# Patient Record
Sex: Female | Born: 1985
Health system: Southern US, Community
[De-identification: ages and names within clinical notes are randomized; demographics above are authoritative.]

## PROBLEM LIST (undated history)

## (undated) ENCOUNTER — Inpatient Hospital Stay (HOSPITAL_COMMUNITY): Payer: Self-pay

## (undated) DIAGNOSIS — J302 Other seasonal allergic rhinitis: Secondary | ICD-10-CM

## (undated) DIAGNOSIS — N926 Irregular menstruation, unspecified: Secondary | ICD-10-CM

## (undated) DIAGNOSIS — N750 Cyst of Bartholin's gland: Secondary | ICD-10-CM

## (undated) DIAGNOSIS — Z8632 Personal history of gestational diabetes: Secondary | ICD-10-CM

## (undated) DIAGNOSIS — Z9109 Other allergy status, other than to drugs and biological substances: Secondary | ICD-10-CM

## (undated) DIAGNOSIS — D573 Sickle-cell trait: Secondary | ICD-10-CM

## (undated) DIAGNOSIS — N390 Urinary tract infection, site not specified: Secondary | ICD-10-CM

## (undated) DIAGNOSIS — T7840XA Allergy, unspecified, initial encounter: Secondary | ICD-10-CM

## (undated) DIAGNOSIS — R51 Headache: Secondary | ICD-10-CM

## (undated) DIAGNOSIS — IMO0002 Reserved for concepts with insufficient information to code with codable children: Secondary | ICD-10-CM

## (undated) DIAGNOSIS — O24419 Gestational diabetes mellitus in pregnancy, unspecified control: Secondary | ICD-10-CM

## (undated) DIAGNOSIS — E785 Hyperlipidemia, unspecified: Secondary | ICD-10-CM

## (undated) DIAGNOSIS — E119 Type 2 diabetes mellitus without complications: Secondary | ICD-10-CM

## (undated) DIAGNOSIS — O139 Gestational [pregnancy-induced] hypertension without significant proteinuria, unspecified trimester: Secondary | ICD-10-CM

## (undated) HISTORY — PX: THERAPEUTIC ABORTION: SHX798

## (undated) HISTORY — PX: OTHER SURGICAL HISTORY: SHX169

---
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
## 2006-01-19 ENCOUNTER — Other Ambulatory Visit: Admission: RE | Admit: 2006-01-19 | Discharge: 2006-01-19 | Payer: Self-pay | Admitting: Obstetrics and Gynecology

## 2006-05-09 ENCOUNTER — Ambulatory Visit (HOSPITAL_COMMUNITY): Admission: RE | Admit: 2006-05-09 | Discharge: 2006-05-09 | Payer: Self-pay | Admitting: Obstetrics and Gynecology

## 2006-08-06 ENCOUNTER — Emergency Department (HOSPITAL_COMMUNITY): Admission: EM | Admit: 2006-08-06 | Discharge: 2006-08-06 | Payer: Self-pay | Admitting: Emergency Medicine

## 2006-10-25 ENCOUNTER — Emergency Department (HOSPITAL_COMMUNITY): Admission: EM | Admit: 2006-10-25 | Discharge: 2006-10-25 | Payer: Self-pay | Admitting: Emergency Medicine

## 2007-10-01 ENCOUNTER — Emergency Department (HOSPITAL_COMMUNITY): Admission: EM | Admit: 2007-10-01 | Discharge: 2007-10-01 | Payer: Self-pay | Admitting: Emergency Medicine

## 2008-03-21 ENCOUNTER — Other Ambulatory Visit: Admission: RE | Admit: 2008-03-21 | Discharge: 2008-03-21 | Payer: Self-pay | Admitting: Obstetrics and Gynecology

## 2008-03-21 ENCOUNTER — Encounter: Payer: Self-pay | Admitting: Obstetrics and Gynecology

## 2008-03-21 ENCOUNTER — Ambulatory Visit: Payer: Self-pay | Admitting: Obstetrics and Gynecology

## 2008-07-29 ENCOUNTER — Emergency Department (HOSPITAL_COMMUNITY): Admission: EM | Admit: 2008-07-29 | Discharge: 2008-07-29 | Payer: Self-pay | Admitting: Emergency Medicine

## 2008-09-15 ENCOUNTER — Ambulatory Visit: Payer: Self-pay | Admitting: Physician Assistant

## 2008-09-15 ENCOUNTER — Inpatient Hospital Stay (HOSPITAL_COMMUNITY): Admission: AD | Admit: 2008-09-15 | Discharge: 2008-09-16 | Payer: Self-pay | Admitting: Obstetrics & Gynecology

## 2008-09-19 ENCOUNTER — Emergency Department (HOSPITAL_COMMUNITY): Admission: EM | Admit: 2008-09-19 | Discharge: 2008-09-19 | Payer: Self-pay | Admitting: Family Medicine

## 2008-10-24 ENCOUNTER — Emergency Department (HOSPITAL_COMMUNITY): Admission: EM | Admit: 2008-10-24 | Discharge: 2008-10-24 | Payer: Self-pay | Admitting: Family Medicine

## 2009-03-25 ENCOUNTER — Emergency Department (HOSPITAL_COMMUNITY): Admission: EM | Admit: 2009-03-25 | Discharge: 2009-03-25 | Payer: Self-pay | Admitting: Emergency Medicine

## 2009-09-15 ENCOUNTER — Emergency Department (HOSPITAL_COMMUNITY): Admission: EM | Admit: 2009-09-15 | Discharge: 2009-09-15 | Payer: Self-pay | Admitting: Family Medicine

## 2009-10-03 ENCOUNTER — Emergency Department (HOSPITAL_COMMUNITY): Admission: EM | Admit: 2009-10-03 | Discharge: 2009-10-03 | Payer: Self-pay | Admitting: Emergency Medicine

## 2009-10-27 ENCOUNTER — Ambulatory Visit: Payer: Self-pay | Admitting: Family

## 2009-10-27 ENCOUNTER — Inpatient Hospital Stay (HOSPITAL_COMMUNITY): Admission: AD | Admit: 2009-10-27 | Discharge: 2009-10-27 | Payer: Self-pay | Admitting: Obstetrics & Gynecology

## 2010-03-06 ENCOUNTER — Encounter: Payer: Self-pay | Admitting: Obstetrics and Gynecology

## 2010-04-29 LAB — WOUND CULTURE: Culture: NORMAL

## 2010-04-30 LAB — POCT PREGNANCY, URINE: Preg Test, Ur: NEGATIVE

## 2010-04-30 LAB — WET PREP, GENITAL
Trich, Wet Prep: NONE SEEN
Yeast Wet Prep HPF POC: NONE SEEN

## 2010-04-30 LAB — POCT URINALYSIS DIPSTICK
Bilirubin Urine: NEGATIVE
Glucose, UA: NEGATIVE mg/dL
Hgb urine dipstick: NEGATIVE
Ketones, ur: NEGATIVE mg/dL
Nitrite: NEGATIVE
Protein, ur: NEGATIVE mg/dL
Specific Gravity, Urine: 1.015 (ref 1.005–1.030)
Urobilinogen, UA: 0.2 mg/dL (ref 0.0–1.0)
pH: 5.5 (ref 5.0–8.0)

## 2010-04-30 LAB — GC/CHLAMYDIA PROBE AMP, GENITAL
Chlamydia, DNA Probe: POSITIVE — AB
GC Probe Amp, Genital: NEGATIVE

## 2010-05-22 LAB — POCT PREGNANCY, URINE: Preg Test, Ur: POSITIVE

## 2010-05-22 LAB — WET PREP, GENITAL
Clue Cells Wet Prep HPF POC: NONE SEEN
Trich, Wet Prep: NONE SEEN

## 2010-05-22 LAB — GC/CHLAMYDIA PROBE AMP, GENITAL
Chlamydia, DNA Probe: NEGATIVE
GC Probe Amp, Genital: NEGATIVE

## 2010-06-30 ENCOUNTER — Other Ambulatory Visit (HOSPITAL_COMMUNITY)
Admission: RE | Admit: 2010-06-30 | Discharge: 2010-06-30 | Disposition: A | Discharge status: Home / Self Care | Payer: Medicaid Other | Source: Ambulatory Visit | Attending: Obstetrics & Gynecology | Admitting: Obstetrics & Gynecology

## 2010-06-30 ENCOUNTER — Other Ambulatory Visit: Payer: Self-pay | Admitting: Obstetrics & Gynecology

## 2010-06-30 ENCOUNTER — Encounter: Payer: Medicaid Other | Admitting: Obstetrics & Gynecology

## 2010-06-30 DIAGNOSIS — R87610 Atypical squamous cells of undetermined significance on cytologic smear of cervix (ASC-US): Secondary | ICD-10-CM

## 2010-06-30 DIAGNOSIS — N870 Mild cervical dysplasia: Secondary | ICD-10-CM | POA: Insufficient documentation

## 2010-07-02 NOTE — H&P (Signed)
Lauren [name], Lauren [name]                         ACCOUNT NO.:  [id number]   MEDICAL RECORD NO.:  [id number]                   PATIENT TYPE:   LOCATION:                                       FACILITY:   PHYSICIAN:  Charles A. Sydnee Cabal, MD            DATE OF BIRTH:  [birth year]   DATE OF ADMISSION:  06/01/2003  DATE OF DISCHARGE:                                HISTORY & PHYSICAL   CHIEF COMPLAINT:  Pregnancy will be at 36 weeks on Monday, June 02, 2003,  with pregnancy induced hypertension.   HISTORY OF PRESENT ILLNESS:  This is a 25 year old para 0, 0, 0, 0 with  estimated date of confinement Jul 02, 2003 which will put her at 36 weeks on  Monday.  To be admitted Sunday night for Cervidil induction.  She has been  admitted over night x2 this past week for hypertension with blood pressures  fluctuating up to 140's to low 150's over 101 to 104.  These would respond  to bed rest but during the hospitalization would fluctuate up into the upper  140's and 90's, mainly down in the 140's over low 90's.  PIH labs have been  negative, last labs were drawn on May 29, 2003:  Creatinine 0.7, uric acid  4.7, AST 15, platelets 233,000,  hematocrit 36.7, hemoglobin 12.5.  Urinalysis has negative protein.  A 24  hour urine was done earlier this week and as I recall was 39 mg total.  Group B strep culture was negative this week.  She is now to be admitted  secondary to ongoing hypertension beyond mild range at times, somewhat  labile, and that she will be at 36 weeks.  She denies pregnancy induced  hypertension symptoms such as scotomata, right upper quadrant pain, headache  or blurred vision.  She notes active fetal movement, denies contractions.  Her NST is reactive in the office today, heart rate 150's.   PAST MEDICAL HISTORY:  None.   SURGICAL HISTORY:  None.   MEDICATIONS:  Prenatal vitamins.   ALLERGIES:  No known drug allergies.   SOCIAL HISTORY:  No tobacco, ethanol or drug use.  She  has a boyfriend who  is supportive currently.   FAMILY HISTORY:  Noncontributory.   For obstetrical labs, please see [facility] chart.  Urinalysis negative for  protein.   PHYSICAL EXAMINATION:  GENERAL:  Alert, oriented, in no distress.  VITAL SIGNS:  Blood pressure 144/94.  HEENT:  Exam grossly within normal limits.  NECK:  Supple without thyromegaly or adenopathy.  LUNGS:  Clear bilaterally.  HEART:  Regular rate and rhythm with a 2/6 systolic ejection murmur at the  left sternal border.  BREAST EXAM:  Deferred, breasts are symmetrical.  ABDOMEN:  Fundal height 35 to 36 cm, gravid, soft and nontender.  Fetal  heart tones are 150's.  Reactive NST, 140 to 150's today.  EXTREMITIES:  Mild edema bilaterally.  Deep tendon reflexes  1 to 2+ and  symmetrical.  No significant clonus.   ASSESSMENT:  Pregnancy induced hypertension without proteinuria and not  meeting criteria for preeclampsia at this time but labile and concerning,  worsening over the last several weeks at bedrest for the last week strictly.  I have discussed alternatives of continued expectant management with the  patient and her family.  Leaned heavily towards induction.  I feel this is  to be considered as prudent in that she has had to be admitted x2 over the  last week secondary to hypertension, and now she is to be found at 36 weeks  the first part of next week when the induction is planned.  All questions  were answered, she understands we will proceed with Cervidil induction to be  followed with Pitocin.  If pressures elevate to a higher range will consider  magnesium prophylaxis.  If labs return abnormal would institute magnesium  prophylaxis, or if she were to become symptomatic, would consider same.  Otherwise if patient maintains acceptable mild pressures no lab changes and  no symptoms, I would try to get her into labor without using magnesium  prophylaxis to retard induction.  She gave informed consent and  will present  Sunday night for induction as noted at this time.  She will remain at  complete bed rest other than getting up for meals or to the bathroom.  Labs  will be repeated Sunday night.                                               Charles A. Sydnee Cabal, MD    CAD/MEDQ  D:  05/29/2003  T:  05/29/2003  Job:  [id number]

## 2010-07-02 NOTE — H&P (Signed)
[name], [name]                       ACCOUNT NO.:  [id number]   MEDICAL RECORD NO.:  [id number]                   PATIENT TYPE:  INP   LOCATION:  9161                                 FACILITY:  [facility]   PHYSICIAN:  [name]. Sydnee Cabal, MD            DATE OF BIRTH:  Feb 06, 1986   DATE OF ADMISSION:  05/26/2003  DATE OF DISCHARGE:                                HISTORY & PHYSICAL   CHIEF COMPLAINT:  1. Vaginal bleeding.  2. Pregnant [redacted] weeks and 5 days.   HISTORY OF PRESENT ILLNESS:  A 25 year old, para 0-0-0-0, [facility] Jul 02, 2003,  putting her at estimated gestational age [redacted] weeks and 5 days.  Now admitted  for 23-hour observation versus induction depending on the outcome of  laboratory and ultrasound testing.  She has been on bed rest for the last  week.  She was admitted for overnight observation last Friday.  Blood  pressures initially 140s/101.  These normalized overnight.  Pregnancy-  induced hypertension laboratories were negative.  She did collect a 24-hour  specimen over the weekend and brings it now to be turned in.  She has no  pregnancy-induced hypertension symptoms, such scotomata, right upper  quadrant pain, headache, or blurred vision.  She noted at 1 p.m. this  afternoon when she got up to go to the bathroom from bed rest that she  passed several small clots.  She did not bring these in.  She has not had  further bleeding since that time.  She notes active fetal movement.  Denies  contractions or ruptured membranes.  NST was reactive here in the office  this afternoon.   PAST MEDICAL HISTORY:  None.   PAST SURGICAL HISTORY:  None.   MEDICATIONS:  Prenatal vitamins.   ALLERGIES:  No known drug allergies.   SOCIAL HISTORY:  No tobacco, ethanol, or drug use.  She has a boyfriend  currently.   FAMILY HISTORY:  Noncontributory.   OBSTETRICAL LABORATORIES:  O positive.  Antibody screen negative.  She is a  sickle cell carrier.  VDRL negative.  Rubella immune.   Hepatitis B surface  antigen negative.  HIV negative.  Pap negative on February 11, 2003.  GC and  chlamydia cultures negative.  A group B Streptococcus  culture was done  today and is pending.  The hemoglobin was 11.2 on April 09, 2003.  One-  hour Glucola 98.  Quad screen normal.  The last PIH laboratories showed  creatinine 0.6, uric acid 4.7, AST 13, and platelets 298.  These were done  in my office on May 21, 2003.  Hospital laboratories were not in the chart,  but were normal as well.  See the hospital record.   PHYSICAL EXAMINATION:  GENERAL APPEARANCE:  Alert and oriented x 3.  No  distress.  VITAL SIGNS:  Blood pressure here in the office 140/92 and repeated 144/94.  Trace to no protein was noted on  urine specimen.  Respirations 16, pulse 90.  HEENT:  Grossly within normal limits.  NECK:  Supple.  CORONARY:  Regular rate and rhythm.  A 2/6 systolic ejection murmur at the  left sternal border.  LUNGS:  Clear bilaterally.  ABDOMEN:  Gravid.  Fundal height 36 cm.  PELVIC:  Normal external female genitalia.  A speculum was placed.  There  was a very slight amount of pink mucoid material in the vagina.  No clots  were seen.  No frank blood was seen.  Nitrazine was equivocal secondary to  the mucus.  There was no evidence of a pool.  The cervix appeared closed.  On digital examination, the cervix was closed.   ASSESSMENT:  1. Unexplained third trimester bleeding.  2. Pregnancy-induced hypertension consistent with mild preeclampsia versus     pregnancy-induced hypertension.  Proteinuria has not been noted to this     point.  Laboratory testing for [facility] laboratories have been negative to     this point.  Blood pressure has responded to bed rest and remains in a     mild range.   PLAN:  The patient will be admitted.  Continuous fetal monitoring will be  undertaken.  She will be observed for further bleeding.  An ultrasound will  be done for AFI, placenta, and position check.   Laboratories will be done:  Kleihauer-Betke, CBC, and PIH panel.  Serial blood pressures will be  undertaken.  If she manifests further significant bleeding, consider partial  abruption and induction would be warranted to be done carefully.  If serial  blood pressures are in the severe range, induction will be warranted as  well.  If no further bleeding is noted and blood pressures normalize and PIH  laboratories are negative, will continue in observation for one to two days  perhaps and consider discharge at that time depending on findings as noted  above.  The patient is in agreement with this recommendation.  We will have  her maintain bed rest with bathroom privileges overnight.                                               [name]. Sydnee Cabal, MD    CAD/MEDQ  D:  05/26/2003  T:  05/26/2003  Job:  [id number]

## 2010-07-02 NOTE — Op Note (Signed)
NAMEBEVERELY, SUEN A                       ACCOUNT NO.:  [id number]   MEDICAL RECORD NO.:  [id number]                   PATIENT TYPE:  INP   LOCATION:  9112                                 FACILITY:  [facility]   PHYSICIAN:  Charles A. Sydnee Cabal, MD            DATE OF BIRTH:  1985-09-04   DATE OF PROCEDURE:  06/02/2003  DATE OF DISCHARGE:                                 OPERATIVE REPORT   This patient was admitted secondary to pregnancy induced hypertension, no  proteinuria and PIH labs were normal.  For this reason, I did not put her on  magnesium.  She has had blood pressures in the 130s/70s at rest when family  is gone but up to 150s/105 when family is present and sitting up.  After  family is gone, sitting up pressures were 140s/80s.  She is 25 years old.  See history and physical as dictated.   At 0810 this morning, the patient was a dimple dilated.  She was begun on  high dose Pitocin after Cervidil was removed.  At 1140 cervix was unchanged.  She continued on high dose Pitocin at 20 mIU per minute.  Fetal heart rate  was reactive without decelerations.  At 1820 she was 3, 90, -1, blood  pressures 140s/80s, 150/106 with a contraction.  No PIH symptoms again.  Fetal heart rate 140 to 150 without decelerations and reactive.  Epidural  was placed shortly after this time at which point I ruptured membranes with  consent and clear amniotic fluid was noted.  She progressed on to complete  cervical dilation at 2125. She had spontaneous vaginal delivery at 2153.  Placenta followed spontaneously at 2200.  There was a first degree  periurethral laceration.  This was repaired with local anesthetic and 3-0  Vicryl.  Placenta was spontaneous, three vessel and intact.  Nuchal cord was  reduced for delivery.  Father of the baby cut the cord.  Estimated blood  loss was 300 mL.  Vigorous female, Apgars 8 and 9.  Cord blood gases  returned 7.27 arterial, 7.36 venous.  Mother and baby recovering and  stable  at this time.                                               Charles A. Sydnee Cabal, MD    CAD/MEDQ  D:  06/02/2003  T:  06/03/2003  Job:  [id number]

## 2010-07-19 ENCOUNTER — Ambulatory Visit: Payer: Medicaid Other | Admitting: Family Medicine

## 2010-08-11 ENCOUNTER — Ambulatory Visit: Payer: Medicaid Other | Admitting: Family Medicine

## 2010-09-24 ENCOUNTER — Inpatient Hospital Stay (INDEPENDENT_AMBULATORY_CARE_PROVIDER_SITE_OTHER)
Admission: RE | Admit: 2010-09-24 | Discharge: 2010-09-24 | Disposition: A | Discharge status: Home / Self Care | Payer: Self-pay | Source: Ambulatory Visit | Attending: Emergency Medicine | Admitting: Emergency Medicine

## 2010-09-24 DIAGNOSIS — Z331 Pregnant state, incidental: Secondary | ICD-10-CM

## 2010-09-24 DIAGNOSIS — L0291 Cutaneous abscess, unspecified: Secondary | ICD-10-CM

## 2010-09-26 ENCOUNTER — Inpatient Hospital Stay (HOSPITAL_COMMUNITY): Payer: Self-pay

## 2010-09-26 ENCOUNTER — Encounter (HOSPITAL_COMMUNITY): Payer: Self-pay | Admitting: *Deleted

## 2010-09-26 ENCOUNTER — Inpatient Hospital Stay (HOSPITAL_COMMUNITY)
Admission: AD | Admit: 2010-09-26 | Discharge: 2010-09-26 | Disposition: A | Discharge status: Home / Self Care | Payer: Self-pay | Source: Ambulatory Visit | Attending: Obstetrics & Gynecology | Admitting: Obstetrics & Gynecology

## 2010-09-26 DIAGNOSIS — N760 Acute vaginitis: Secondary | ICD-10-CM | POA: Insufficient documentation

## 2010-09-26 DIAGNOSIS — A499 Bacterial infection, unspecified: Secondary | ICD-10-CM | POA: Insufficient documentation

## 2010-09-26 DIAGNOSIS — B9689 Other specified bacterial agents as the cause of diseases classified elsewhere: Secondary | ICD-10-CM | POA: Insufficient documentation

## 2010-09-26 DIAGNOSIS — O2390 Unspecified genitourinary tract infection in pregnancy, unspecified trimester: Secondary | ICD-10-CM | POA: Insufficient documentation

## 2010-09-26 DIAGNOSIS — R109 Unspecified abdominal pain: Secondary | ICD-10-CM | POA: Insufficient documentation

## 2010-09-26 DIAGNOSIS — Z331 Pregnant state, incidental: Secondary | ICD-10-CM

## 2010-09-26 LAB — URINALYSIS, ROUTINE W REFLEX MICROSCOPIC
Bilirubin Urine: NEGATIVE
Glucose, UA: NEGATIVE mg/dL
Ketones, ur: NEGATIVE mg/dL
pH: 5.5 (ref 5.0–8.0)

## 2010-09-26 LAB — HCG, QUANTITATIVE, PREGNANCY: hCG, Beta Chain, Quant, S: 25398 m[IU]/mL — ABNORMAL HIGH (ref <5)

## 2010-09-26 LAB — WET PREP, GENITAL
Trich, Wet Prep: NONE SEEN
Yeast Wet Prep HPF POC: NONE SEEN

## 2010-09-26 LAB — CBC
Hemoglobin: 12.8 g/dL (ref 12.0–15.0)
MCH: 28.8 pg (ref 26.0–34.0)
MCHC: 35 g/dL (ref 30.0–36.0)
MCV: 82.2 fL (ref 78.0–100.0)
Platelets: 238 10*3/uL (ref 150–400)

## 2010-09-26 MED ORDER — METRONIDAZOLE 500 MG PO TABS: 500.0000 mg | ORAL_TABLET | Freq: Two times a day (BID) | ORAL | Status: DC

## 2010-09-26 NOTE — Progress Notes (Signed)
Pt c/o of cramping since yesterday-found out she was pregnant on Friday at [facility] ED

## 2010-09-26 NOTE — ED Provider Notes (Signed)
History     Chief Complaint  Patient presents with  . Abdominal Cramping   HPI Pt is here with report of midpelvic cramping that started yesterday.  Pain is rated a 6/10.  Denies vaginal bleeding or  Abnormal vaginal discharge.     Past Medical History  Diagnosis Date  . No pertinent past medical history     Past Surgical History  Procedure Date  . No past surgeries     No family history on file.  History  Substance Use Topics  . Smoking status: Former Smoker    Quit date: 09/24/2010  . Smokeless tobacco: Not on file  . Alcohol Use: No    Allergies: No Known Allergies  Prescriptions prior to admission  Medication Sig Dispense Refill  . clindamycin (CLEOCIN) 300 MG capsule Take 300 mg by mouth 4 (four) times daily.        . CYANOCOBALAMIN PO Take 1 tablet by mouth daily.        . Multiple Vitamins-Minerals (MULTIVITAMIN WITH MINERALS) tablet Take 1 tablet by mouth daily.          Review of Systems  Gastrointestinal: Positive for abdominal pain.  Genitourinary: Negative for dysuria and hematuria.  All other systems reviewed and are negative.   Physical Exam   Blood pressure 121/75, pulse 94, temperature 98.6 F (37 C), temperature source Oral, resp. rate 20, height 5\' 2"  (1.575 m), weight 65.318 kg (144 lb), last menstrual period 08/18/2010.  Physical Exam  Constitutional: She is oriented to person, place, and time. She appears well-developed and well-nourished. No distress.  HENT:  Head: Normocephalic.  Neck: Normal range of motion. Neck supple.  Cardiovascular: Normal rate, regular rhythm and normal heart sounds.  Exam reveals no gallop and no friction rub.   No murmur heard. Respiratory: Effort normal and breath sounds normal. No respiratory distress.  GI: She exhibits no mass. There is no tenderness. There is no rebound, no guarding and no CVA tenderness.  Genitourinary: Uterus is enlarged. Cervix exhibits no motion tenderness and no discharge. Vaginal  discharge: clear.       Cervix closed  Musculoskeletal: Normal range of motion.  Neurological: She is alert and oriented to person, place, and time.  Skin: Skin is warm and dry.  Psychiatric: She has a normal mood and affect.    MAU Course  Procedures  [place]- +IUP, +yolk sac; no fetal pole CBC-wnl [id number] ABO/RH neg (no bleeding) Wet Prep - BV  Assessment and Plan  Bacterial Vaginosis Pregnancy  Plan: Rx Flagyl Repeat BHCG in 48hrs and ultrasound in 7 days (per pt request) Education - discussed that based on HCG it is likely an anembryonic pregnancy Ectopic precautions given  [facility] 09/26/2010, 9:22 PM

## 2010-09-26 NOTE — Consult Note (Signed)
Reviewed HPI/Exam/US/Labs>may wait to repeat hcg and [place], but likely an anembryonic pregnancy.  Attestation of Attending Supervision of Advanced Practitioner (CNM/NP): Evaluation and management procedures were performed by the Advanced Practitioner under my supervision and collaboration. I have reviewed the Advanced Practitioner's note and chart, and I agree with the management and plan.  LEGGETT,KELLY H. 11:01 AM

## 2010-09-26 NOTE — Progress Notes (Signed)
Pt was just at [facility] and found out she was pregnant.  Pt is here today for abdominal pain.  Was put on clindamycin for sores on here hand.  They told her it was cellulitis.

## 2010-09-28 ENCOUNTER — Inpatient Hospital Stay (HOSPITAL_COMMUNITY)
Admission: AD | Admit: 2010-09-28 | Discharge: 2010-09-28 | Disposition: A | Discharge status: Home / Self Care | Payer: Self-pay | Source: Ambulatory Visit | Attending: Obstetrics & Gynecology | Admitting: Obstetrics & Gynecology

## 2010-09-28 DIAGNOSIS — O200 Threatened abortion: Secondary | ICD-10-CM | POA: Insufficient documentation

## 2010-09-28 DIAGNOSIS — O3680X0 Pregnancy with inconclusive fetal viability, not applicable or unspecified: Secondary | ICD-10-CM

## 2010-09-28 LAB — HCG, QUANTITATIVE, PREGNANCY: hCG, Beta Chain, Quant, S: 32492 m[IU]/mL — ABNORMAL HIGH (ref <5)

## 2010-09-28 NOTE — Progress Notes (Signed)
Pt states, " I am here for labs today. I am not having pain today or bleeding."

## 2010-09-28 NOTE — ED Provider Notes (Signed)
History     Chief Complaint  Patient presents with  . Follow-up   HPI  Here for followup Quant HCG. Was to be scheduled for followup U/S per pt request in 7 days (after last visit) ROS Physical Exam  Deferred Last menstrual period 08/18/2010.  Physical Exam  Deferred  Quant HCG on 8/12 = 16109                          8/14 = 32492  MAU Course  Procedures  MDM   Assessment and Plan  Repeat U/S on 8/20//12 at [facility] 09/28/2010, 4:04 PM

## 2010-10-04 ENCOUNTER — Ambulatory Visit (HOSPITAL_COMMUNITY): Payer: Self-pay

## 2010-10-04 ENCOUNTER — Ambulatory Visit (HOSPITAL_COMMUNITY): Admit: 2010-10-04 | Payer: Self-pay

## 2010-10-05 ENCOUNTER — Ambulatory Visit (HOSPITAL_COMMUNITY)
Admission: RE | Admit: 2010-10-05 | Discharge: 2010-10-05 | Disposition: A | Discharge status: Home / Self Care | Payer: Self-pay | Source: Ambulatory Visit | Attending: Obstetrics & Gynecology | Admitting: Obstetrics & Gynecology

## 2010-10-05 ENCOUNTER — Inpatient Hospital Stay (HOSPITAL_COMMUNITY)
Admission: AD | Admit: 2010-10-05 | Discharge: 2010-10-05 | Disposition: A | Discharge status: Home / Self Care | Payer: Self-pay | Source: Ambulatory Visit | Attending: Family Medicine | Admitting: Family Medicine

## 2010-10-05 ENCOUNTER — Encounter (HOSPITAL_COMMUNITY): Payer: Self-pay | Admitting: *Deleted

## 2010-10-05 DIAGNOSIS — Z331 Pregnant state, incidental: Secondary | ICD-10-CM

## 2010-10-05 DIAGNOSIS — B9689 Other specified bacterial agents as the cause of diseases classified elsewhere: Secondary | ICD-10-CM | POA: Insufficient documentation

## 2010-10-05 DIAGNOSIS — N760 Acute vaginitis: Secondary | ICD-10-CM | POA: Insufficient documentation

## 2010-10-05 DIAGNOSIS — Z3689 Encounter for other specified antenatal screening: Secondary | ICD-10-CM | POA: Insufficient documentation

## 2010-10-05 DIAGNOSIS — O2390 Unspecified genitourinary tract infection in pregnancy, unspecified trimester: Secondary | ICD-10-CM | POA: Insufficient documentation

## 2010-10-05 DIAGNOSIS — A499 Bacterial infection, unspecified: Secondary | ICD-10-CM | POA: Insufficient documentation

## 2010-10-05 HISTORY — DX: Sickle-cell trait: D57.3

## 2010-10-05 HISTORY — DX: Headache: R51

## 2010-10-05 HISTORY — DX: Gestational (pregnancy-induced) hypertension without significant proteinuria, unspecified trimester: O13.9

## 2010-10-05 HISTORY — DX: Urinary tract infection, site not specified: N39.0

## 2010-10-05 MED ORDER — FLUCONAZOLE 150 MG PO TABS: 150.0000 mg | ORAL_TABLET | Freq: Every day | ORAL | Status: DC

## 2010-10-05 NOTE — ED Provider Notes (Signed)
Chart reviewed and agree with management and plan.  

## 2010-10-05 NOTE — ED Provider Notes (Signed)
Lauren [name] is a 25 y.o. [id number] at @[redacted]w[redacted]d  by LMP Chief Complaint  Patient presents with  . Possible Pregnancy  SUBJECTIVE: She was initially seen here on 09/26/2010 with a Quant of 25,000 398 and some cramping her follow Quant on 09/28/2010 is 13,000 492. Of note she was diagnosed with BV treated Flagyl which she completed. Today she's here for her one-week followup ultrasound which shows viable IUP 6 weeks 6 days with small SCH.she's had no bleeding or pain but today she does have vaginal itching which she believes is a yeast infection.  OBJECTIVE: Filed Vitals:   10/05/10 1542  BP: 113/77  Pulse: 91  Temp: 98.8 F (37.1 C)  Resp: 18   [place]: [redacted]w[redacted]d viable IUP Exam deferred  ASSESSMENT: Normal early pregnancy. Presumptive yeast vaginitis PLAN: Cont PNVs. She F/U at [facility] as she requests midwifery care.  Rx Diflucan

## 2010-10-05 NOTE — Progress Notes (Signed)
Rollover from [place],. No complaints except "yeast infection" symptoms started 3 days ago.

## 2010-10-18 ENCOUNTER — Emergency Department (HOSPITAL_COMMUNITY)
Admission: EM | Admit: 2010-10-18 | Discharge: 2010-10-19 | Disposition: A | Discharge status: Home / Self Care | Payer: No Typology Code available for payment source | Attending: Emergency Medicine | Admitting: Emergency Medicine

## 2010-10-18 DIAGNOSIS — O99891 Other specified diseases and conditions complicating pregnancy: Secondary | ICD-10-CM | POA: Insufficient documentation

## 2010-10-18 DIAGNOSIS — M549 Dorsalgia, unspecified: Secondary | ICD-10-CM | POA: Insufficient documentation

## 2010-10-18 DIAGNOSIS — S335XXA Sprain of ligaments of lumbar spine, initial encounter: Secondary | ICD-10-CM | POA: Insufficient documentation

## 2010-10-18 DIAGNOSIS — R109 Unspecified abdominal pain: Secondary | ICD-10-CM | POA: Insufficient documentation

## 2010-10-18 LAB — URINALYSIS, ROUTINE W REFLEX MICROSCOPIC
Bilirubin Urine: NEGATIVE
Hgb urine dipstick: NEGATIVE
Ketones, ur: NEGATIVE mg/dL
Nitrite: NEGATIVE
pH: 5.5 (ref 5.0–8.0)

## 2010-12-01 LAB — WET PREP, GENITAL: Trich, Wet Prep: NONE SEEN

## 2010-12-09 LAB — RPR: RPR: NONREACTIVE

## 2011-02-15 NOTE — L&D Delivery Note (Signed)
Delivery Note At 11:02 AM a viable female was delivered via Vaginal, Spontaneous Delivery (Presentation: Left Occiput Anterior).  APGAR: 9,9 ; weight 7 lb 6.9 oz (3370 g).   Placenta status: Intact, Spontaneous.  Cord:  with the following complications: None.  Anesthesia: Epidural  Episiotomy: None Lacerations: None Suture Repair: none Est. Blood Loss (mL): 300  Mom to postpartum.  Baby to stay with mom.  Jozsef Wescoat D 05/11/2011, 11:17 AM

## 2011-03-23 ENCOUNTER — Encounter (HOSPITAL_COMMUNITY): Payer: Self-pay | Admitting: *Deleted

## 2011-03-23 ENCOUNTER — Inpatient Hospital Stay (HOSPITAL_COMMUNITY)
Admission: AD | Admit: 2011-03-23 | Discharge: 2011-03-24 | Disposition: A | Discharge status: Home / Self Care | Payer: Medicaid Other | Source: Ambulatory Visit | Attending: Obstetrics and Gynecology | Admitting: Obstetrics and Gynecology

## 2011-03-23 DIAGNOSIS — O26899 Other specified pregnancy related conditions, unspecified trimester: Secondary | ICD-10-CM

## 2011-03-23 DIAGNOSIS — O99891 Other specified diseases and conditions complicating pregnancy: Secondary | ICD-10-CM | POA: Insufficient documentation

## 2011-03-23 DIAGNOSIS — N898 Other specified noninflammatory disorders of vagina: Secondary | ICD-10-CM

## 2011-03-23 DIAGNOSIS — J069 Acute upper respiratory infection, unspecified: Secondary | ICD-10-CM | POA: Insufficient documentation

## 2011-03-23 DIAGNOSIS — O368130 Decreased fetal movements, third trimester, not applicable or unspecified: Secondary | ICD-10-CM

## 2011-03-23 DIAGNOSIS — O368190 Decreased fetal movements, unspecified trimester, not applicable or unspecified: Secondary | ICD-10-CM | POA: Insufficient documentation

## 2011-03-23 NOTE — Progress Notes (Signed)
SSE per CNM.  Wet prep and fern collected.

## 2011-03-23 NOTE — Progress Notes (Signed)
Ivonne Andrew, CNM at bedside. Assessment done and poc discussed with pt.  Strip reviewed.

## 2011-03-23 NOTE — Progress Notes (Signed)
Pt reports cold symptoms that began this morning.  Pt G3 P1 at 31wks, felt gush of clear fluid 2 days ago, having cramping.

## 2011-03-23 NOTE — Progress Notes (Signed)
Pt states a gush of fluid 2 days ago but has had no leaking since.

## 2011-05-02 ENCOUNTER — Inpatient Hospital Stay (HOSPITAL_COMMUNITY)
Admission: AD | Admit: 2011-05-02 | Discharge: 2011-05-02 | Disposition: A | Discharge status: Home / Self Care | Payer: Medicaid Other | Source: Ambulatory Visit | Attending: Obstetrics and Gynecology | Admitting: Obstetrics and Gynecology

## 2011-05-02 ENCOUNTER — Encounter (HOSPITAL_COMMUNITY): Payer: Self-pay | Admitting: *Deleted

## 2011-05-02 DIAGNOSIS — O212 Late vomiting of pregnancy: Secondary | ICD-10-CM | POA: Insufficient documentation

## 2011-05-02 DIAGNOSIS — O219 Vomiting of pregnancy, unspecified: Secondary | ICD-10-CM

## 2011-05-02 LAB — URINALYSIS, ROUTINE W REFLEX MICROSCOPIC
Glucose, UA: NEGATIVE mg/dL
Leukocytes, UA: NEGATIVE
Protein, ur: NEGATIVE mg/dL
pH: 6 (ref 5.0–8.0)

## 2011-05-02 MED ORDER — SODIUM CHLORIDE 0.9 % IV SOLN: 25.0000 mg | Freq: Once | INTRAVENOUS | Status: DC

## 2011-05-02 MED ORDER — PROMETHAZINE HCL 25 MG/ML IJ SOLN
25.0000 mg | Freq: Once | INTRAVENOUS | Status: DC
  Filled 2011-05-02: qty 1

## 2011-05-02 MED ORDER — ONDANSETRON 8 MG PO TBDP
8.0000 mg | ORAL_TABLET | Freq: Once | ORAL | Status: AC
  Administered 2011-05-02: 8 mg via ORAL
  Filled 2011-05-02: qty 1

## 2011-05-02 NOTE — MAU Note (Signed)
Pt called out to say that she does not wish to have IV fluids.  Would rather go home and take zofran that she has prescription for.

## 2011-05-02 NOTE — MAU Note (Signed)
PT SAYS SHE HAS  VOMITED X2 TODAY.  NONE YESTERDAY.  HAS VOMITED ALL PREG.   SHE FELT HOT .  NO DIARRHEA, SORE THROAT.     SAYS UC FEEL LIKE  BRAXTON HICKS UC.  1 CM  IN OFFICE LAST WEEK.  HAS  APPOINTMENT TOMORROW

## 2011-05-02 NOTE — MAU Provider Note (Signed)
History     CSN: [id number]  Arrival date and time: 05/02/11 1958   First Provider Initiated Contact with Patient 05/02/11 2036      No chief complaint on file.  HPI Lauren [name] is 26 y.o. [phone] [redacted]w[redacted]d weeks presenting with nausea and vomiting entire pregnancy.  Has medicine that they told her to take prior to eating. Began this am with vomiting a lot 10 am and again at 7pm.   Unable to keep fluid or solid food down.  Did not try medication today.  Has appt tomorrow.  Denies contact with viral illness.  Having abdominal pain on and offf.  Felt hot but didn't check it.      Past Medical History  Diagnosis Date  . Pregnancy induced hypertension   . Headache   . Sickle cell trait   . Urinary tract infection     Past Surgical History  Procedure Date  . No past surgeries   . Therapeutic abortion     History reviewed. No pertinent family history.  History  Substance Use Topics  . Smoking status: Former Smoker    Quit date: 09/24/2010  . Smokeless tobacco: Not on file  . Alcohol Use: No    Allergies: No Known Allergies  Prescriptions prior to admission  Medication Sig Dispense Refill  . calcium carbonate (TUMS - DOSED IN MG ELEMENTAL CALCIUM) 500 MG chewable tablet Chew 1 tablet by mouth daily as needed. For heartburn      . Prenatal Vit-Fe Fumarate-FA (PRENATAL MULTIVITAMIN) TABS Take 1 tablet by mouth daily.        Review of Systems  Constitutional: Positive for fever (felt hot did not take temp at home).  Gastrointestinal: Positive for nausea, vomiting and abdominal pain (when vomiting). Negative for diarrhea.  Genitourinary: Negative.    Physical Exam   Blood pressure 121/81, pulse 106, temperature 98.7 F (37.1 C), resp. rate 20, height 5\' 3"  (1.6 m), weight 74.503 kg (164 lb 4 oz), last menstrual period 08/18/2010.  Physical Exam  Constitutional: She is oriented to person, place, and time. She appears well-developed and well-nourished. No distress  (does not appear to feel well).  HENT:  Head: Normocephalic.  Neck: Normal range of motion.  Respiratory: Effort normal.  GI: Soft. There is no tenderness.  Neurological: She is alert and oriented to person, place, and time.  Skin: Skin is warm and dry.  Psychiatric: She has a normal mood and affect. Her behavior is normal. Judgment and thought content normal.      Results for orders placed during the hospital encounter of 05/02/11 (from the past 24 hour(s))  URINALYSIS, ROUTINE W REFLEX MICROSCOPIC     Status: Normal   Collection Time   05/02/11  8:13 PM      Component Value Range   Color, Urine YELLOW  YELLOW    APPearance CLEAR  CLEAR    Specific Gravity, Urine 1.020  1.005 - 1.030    pH 6.0  5.0 - 8.0    Glucose, UA NEGATIVE  NEGATIVE (mg/dL)   Hgb urine dipstick NEGATIVE  NEGATIVE    Bilirubin Urine NEGATIVE  NEGATIVE    Ketones, ur NEGATIVE  NEGATIVE (mg/dL)   Protein, ur NEGATIVE  NEGATIVE (mg/dL)   Urobilinogen, UA 0.2  0.0 - 1.0 (mg/dL)   Nitrite NEGATIVE  NEGATIVE    Leukocytes, UA NEGATIVE  NEGATIVE    Fetal monitor strip is reactive MAU Course  Procedures  MDM 20:45  Reported MSE to  Dr. Manon Hilding.  Order given for 1 liter of D5LR with Phenergan 25mg . Reported reactive fetal monitor strip.   If sxs worsen, continue zofran she has at home, may give Phenergan.  Reschedule appt for tomorrow if sxs continue to later this week.    21:12  Patient called out and reported that she does not want IV hydration but would rather take the medication she has at home for nausea.  She reports being hungry but I wanted to give her crackers after her IV fluid was in.  Zofran 8mg  ODT was ordered and given prior to discharge.  She can eat at home.    Assessment and Plan  A; Nausea and vomiting at [redacted]w[redacted]d gestation  P: Instructed to take the Zofran she has at home for nausea as prescribed.      Keep appt in office tomorrow if better, if not call and reschedule until the end of the  week.  Lauren [name],EVE M 05/02/2011, 8:36 PM

## 2011-05-02 NOTE — Discharge Instructions (Signed)
Nausea and Vomiting   Nausea means you feel sick to your stomach. Throwing up (vomiting) is a reflex where stomach contents come out of your mouth.   HOME CARE   Take medicine as told by your doctor.   Do not force yourself to eat. However, you do need to drink fluids.   If you feel like eating, eat a normal diet as told by your doctor.   Eat rice, wheat, potatoes, bread, lean meats, yogurt, fruits, and vegetables.   Avoid high-fat foods.   Drink enough fluids to keep your pee (urine) clear or pale yellow.   Ask your doctor how to replace body fluid losses (rehydrate). Signs of body fluid loss (dehydration) include:   Feeling very thirsty.   Dry lips and mouth.   Feeling dizzy.   Dark pee.   Peeing less than normal.   Feeling confused.   Fast breathing or heart rate.   GET HELP RIGHT AWAY IF:   You have blood in your throw up.   You have black or bloody poop (stool).   You have a bad headache or stiff neck.   You feel confused.   You have bad belly (abdominal) pain.   You have chest pain or trouble breathing.   You do not pee at least once every 8 hours.   You have cold, clammy skin.   You keep throwing up after 24 to 48 hours.   You have a fever.   MAKE SURE YOU:   Understand these instructions.   Will watch your condition.   Will get help right away if you are not doing well or get worse.   Document Released: 07/20/2007 Document Revised: 01/20/2011 Document Reviewed: 07/02/2010   [employer] Patient Information 2012 [employer].

## 2011-05-02 NOTE — MAU Note (Signed)
E. Key, NP at bedside. Assessment done and poc discussed with pt.  

## 2011-05-11 ENCOUNTER — Encounter (HOSPITAL_COMMUNITY): Payer: Self-pay | Admitting: *Deleted

## 2011-05-11 ENCOUNTER — Encounter (HOSPITAL_COMMUNITY): Payer: Self-pay | Admitting: Anesthesiology

## 2011-05-11 ENCOUNTER — Inpatient Hospital Stay (HOSPITAL_COMMUNITY)
Admission: AD | Admit: 2011-05-11 | Discharge: 2011-05-12 | DRG: 775 | Disposition: A | Discharge status: Home / Self Care | Payer: Medicaid Other | Attending: Obstetrics and Gynecology | Admitting: Obstetrics and Gynecology

## 2011-05-11 ENCOUNTER — Inpatient Hospital Stay (HOSPITAL_COMMUNITY): Payer: Medicaid Other | Admitting: Anesthesiology

## 2011-05-11 DIAGNOSIS — IMO0002 Reserved for concepts with insufficient information to code with codable children: Secondary | ICD-10-CM | POA: Diagnosis present

## 2011-05-11 HISTORY — DX: Reserved for concepts with insufficient information to code with codable children: IMO0002

## 2011-05-11 LAB — CBC
HCT: 40.4 % (ref 36.0–46.0)
MCHC: 34.2 g/dL (ref 30.0–36.0)
RDW: 13.8 % (ref 11.5–15.5)

## 2011-05-11 LAB — ANTIBODY SCREEN: Antibody Screen: NEGATIVE

## 2011-05-11 LAB — RUBELLA ANTIBODY, IGM: Rubella: IMMUNE

## 2011-05-11 MED ORDER — PHENYLEPHRINE 40 MCG/ML (10ML) SYRINGE FOR IV PUSH (FOR BLOOD PRESSURE SUPPORT)
80.0000 ug | PREFILLED_SYRINGE | INTRAVENOUS | Status: DC | PRN
  Filled 2011-05-11: qty 2

## 2011-05-11 MED ORDER — CALCIUM CARBONATE ANTACID 500 MG PO CHEW: 1.0000 | CHEWABLE_TABLET | Freq: Every day | ORAL | Status: DC | PRN

## 2011-05-11 MED ORDER — FENTANYL 2.5 MCG/ML BUPIVACAINE 1/10 % EPIDURAL INFUSION (WH - ANES)
INTRAMUSCULAR | Status: DC | PRN
  Administered 2011-05-11: 14 mL/h via EPIDURAL

## 2011-05-11 MED ORDER — FLEET ENEMA 7-19 GM/118ML RE ENEM: 1.0000 | ENEMA | RECTAL | Status: DC | PRN

## 2011-05-11 MED ORDER — EPHEDRINE 5 MG/ML INJ
10.0000 mg | INTRAVENOUS | Status: DC | PRN
  Filled 2011-05-11: qty 2
  Filled 2011-05-11: qty 4

## 2011-05-11 MED ORDER — BENZOCAINE-MENTHOL 20-0.5 % EX AERO: 1.0000 "application " | INHALATION_SPRAY | CUTANEOUS | Status: DC | PRN

## 2011-05-11 MED ORDER — LACTATED RINGERS IV SOLN
500.0000 mL | Freq: Once | INTRAVENOUS | Status: AC
  Administered 2011-05-11: 1000 mL via INTRAVENOUS

## 2011-05-11 MED ORDER — OXYTOCIN 20 UNITS IN LACTATED RINGERS INFUSION - SIMPLE: 1.0000 m[IU]/min | INTRAVENOUS | Status: DC

## 2011-05-11 MED ORDER — WITCH HAZEL-GLYCERIN EX PADS: 1.0000 "application " | MEDICATED_PAD | CUTANEOUS | Status: DC | PRN

## 2011-05-11 MED ORDER — OXYTOCIN 20 UNITS IN LACTATED RINGERS INFUSION - SIMPLE: 125.0000 mL/h | INTRAVENOUS | Status: DC | PRN

## 2011-05-11 MED ORDER — LACTATED RINGERS IV SOLN: 500.0000 mL | INTRAVENOUS | Status: DC | PRN

## 2011-05-11 MED ORDER — EPHEDRINE 5 MG/ML INJ
10.0000 mg | INTRAVENOUS | Status: DC | PRN
  Filled 2011-05-11: qty 2

## 2011-05-11 MED ORDER — METHYLERGONOVINE MALEATE 0.2 MG/ML IJ SOLN: 0.2000 mg | INTRAMUSCULAR | Status: DC | PRN

## 2011-05-11 MED ORDER — METHYLERGONOVINE MALEATE 0.2 MG PO TABS: 0.2000 mg | ORAL_TABLET | ORAL | Status: DC | PRN

## 2011-05-11 MED ORDER — LACTATED RINGERS IV SOLN
INTRAVENOUS | Status: DC
  Administered 2011-05-11 (×3): via INTRAVENOUS

## 2011-05-11 MED ORDER — OXYTOCIN BOLUS FROM INFUSION
500.0000 mL | Freq: Once | INTRAVENOUS | Status: DC
  Filled 2011-05-11: qty 500

## 2011-05-11 MED ORDER — CITRIC ACID-SODIUM CITRATE 334-500 MG/5ML PO SOLN: 30.0000 mL | ORAL | Status: DC | PRN

## 2011-05-11 MED ORDER — DIPHENHYDRAMINE HCL 25 MG PO CAPS: 25.0000 mg | ORAL_CAPSULE | Freq: Four times a day (QID) | ORAL | Status: DC | PRN

## 2011-05-11 MED ORDER — BUTORPHANOL TARTRATE 2 MG/ML IJ SOLN
2.0000 mg | INTRAMUSCULAR | Status: DC | PRN
  Administered 2011-05-11 (×3): 2 mg via INTRAVENOUS
  Filled 2011-05-11 (×3): qty 1

## 2011-05-11 MED ORDER — DIBUCAINE 1 % RE OINT: 1.0000 "application " | TOPICAL_OINTMENT | RECTAL | Status: DC | PRN

## 2011-05-11 MED ORDER — ONDANSETRON HCL 4 MG/2ML IJ SOLN
4.0000 mg | INTRAMUSCULAR | Status: DC | PRN
  Administered 2011-05-11: 4 mg via INTRAVENOUS

## 2011-05-11 MED ORDER — DIPHENHYDRAMINE HCL 50 MG/ML IJ SOLN: 12.5000 mg | INTRAMUSCULAR | Status: DC | PRN

## 2011-05-11 MED ORDER — SIMETHICONE 80 MG PO CHEW: 80.0000 mg | CHEWABLE_TABLET | ORAL | Status: DC | PRN

## 2011-05-11 MED ORDER — ZOLPIDEM TARTRATE 5 MG PO TABS: 5.0000 mg | ORAL_TABLET | Freq: Every evening | ORAL | Status: DC | PRN

## 2011-05-11 MED ORDER — ONDANSETRON HCL 4 MG/2ML IJ SOLN
4.0000 mg | Freq: Four times a day (QID) | INTRAMUSCULAR | Status: DC | PRN
  Filled 2011-05-11: qty 2

## 2011-05-11 MED ORDER — PHENYLEPHRINE 40 MCG/ML (10ML) SYRINGE FOR IV PUSH (FOR BLOOD PRESSURE SUPPORT)
80.0000 ug | PREFILLED_SYRINGE | INTRAVENOUS | Status: DC | PRN
  Filled 2011-05-11: qty 2
  Filled 2011-05-11: qty 5

## 2011-05-11 MED ORDER — SODIUM BICARBONATE 8.4 % IV SOLN
INTRAVENOUS | Status: DC | PRN
  Administered 2011-05-11: 4 mL via EPIDURAL

## 2011-05-11 MED ORDER — LIDOCAINE HCL (PF) 1 % IJ SOLN
30.0000 mL | INTRAMUSCULAR | Status: DC | PRN
  Filled 2011-05-11: qty 30

## 2011-05-11 MED ORDER — ACETAMINOPHEN 325 MG PO TABS: 650.0000 mg | ORAL_TABLET | ORAL | Status: DC | PRN

## 2011-05-11 MED ORDER — SENNOSIDES-DOCUSATE SODIUM 8.6-50 MG PO TABS
2.0000 | ORAL_TABLET | Freq: Every day | ORAL | Status: DC
  Administered 2011-05-11: 2 via ORAL

## 2011-05-11 MED ORDER — OXYTOCIN 20 UNITS IN LACTATED RINGERS INFUSION - SIMPLE
125.0000 mL/h | Freq: Once | INTRAVENOUS | Status: AC
  Administered 2011-05-11: 125 mL/h via INTRAVENOUS
  Filled 2011-05-11: qty 1000

## 2011-05-11 MED ORDER — OXYCODONE-ACETAMINOPHEN 5-325 MG PO TABS: 1.0000 | ORAL_TABLET | ORAL | Status: DC | PRN

## 2011-05-11 MED ORDER — FENTANYL 2.5 MCG/ML BUPIVACAINE 1/10 % EPIDURAL INFUSION (WH - ANES)
14.0000 mL/h | INTRAMUSCULAR | Status: DC
  Filled 2011-05-11: qty 60

## 2011-05-11 MED ORDER — LANOLIN HYDROUS EX OINT: TOPICAL_OINTMENT | CUTANEOUS | Status: DC | PRN

## 2011-05-11 MED ORDER — OXYCODONE-ACETAMINOPHEN 5-325 MG PO TABS
1.0000 | ORAL_TABLET | ORAL | Status: DC | PRN
  Administered 2011-05-12: 1 via ORAL
  Filled 2011-05-11: qty 1

## 2011-05-11 MED ORDER — MEASLES, MUMPS & RUBELLA VAC ~~LOC~~ INJ
0.5000 mL | INJECTION | Freq: Once | SUBCUTANEOUS | Status: DC
  Filled 2011-05-11: qty 0.5

## 2011-05-11 MED ORDER — IBUPROFEN 600 MG PO TABS
600.0000 mg | ORAL_TABLET | Freq: Four times a day (QID) | ORAL | Status: DC | PRN
  Filled 2011-05-11: qty 1

## 2011-05-11 MED ORDER — MAGNESIUM HYDROXIDE 400 MG/5ML PO SUSP: 30.0000 mL | ORAL | Status: DC | PRN

## 2011-05-11 MED ORDER — PRENATAL MULTIVITAMIN CH: 1.0000 | ORAL_TABLET | Freq: Every morning | ORAL | Status: DC

## 2011-05-11 MED ORDER — TETANUS-DIPHTH-ACELL PERTUSSIS 5-2.5-18.5 LF-MCG/0.5 IM SUSP: 0.5000 mL | Freq: Once | INTRAMUSCULAR | Status: DC

## 2011-05-11 MED ORDER — ONDANSETRON HCL 4 MG PO TABS: 4.0000 mg | ORAL_TABLET | ORAL | Status: DC | PRN

## 2011-05-11 MED ORDER — PRENATAL MULTIVITAMIN CH
1.0000 | ORAL_TABLET | Freq: Every day | ORAL | Status: DC
  Administered 2011-05-11 – 2011-05-12 (×2): 1 via ORAL
  Filled 2011-05-11 (×2): qty 1

## 2011-05-11 MED ORDER — IBUPROFEN 600 MG PO TABS
600.0000 mg | ORAL_TABLET | Freq: Four times a day (QID) | ORAL | Status: DC
  Administered 2011-05-11 – 2011-05-12 (×5): 600 mg via ORAL
  Filled 2011-05-11 (×5): qty 1

## 2011-05-11 MED ORDER — TERBUTALINE SULFATE 1 MG/ML IJ SOLN: 0.2500 mg | Freq: Once | INTRAMUSCULAR | Status: DC | PRN

## 2011-05-11 NOTE — H&P (Signed)
TAWNIE EHRESMAN is a 26 y.o. female G3P1011 at 38+ with SROM for clear fluid.  +FM, no LOF, no VB, occ ctx.  ROM at 11:30, clear.  Uncomplicated PNC.  Maternal Medical History:  Reason for admission: Reason for admission: rupture of membranes.  Contractions: Frequency: regular.    Fetal activity: Perceived fetal activity is normal.      OB History    Grav Para Term Preterm Abortions TAB SAB Ect Mult Living   3 1 0 1 1 1    1     G1 SVD 4#12 SVD female; G2 TAB G3 present  Past Medical History  Diagnosis Date  . Pregnancy induced hypertension   . Headache   . Sickle cell trait   . Urinary tract infection   . SROM (spontaneous rupture of membranes) 05/11/2011   Past Surgical History  Procedure Date  . No past surgeries   . Therapeutic abortion    Family History: Arthritis, asthma, lupus Social History:  reports that she quit smoking about 7 months ago. She does not have any smokeless tobacco history on file. She reports that she does not drink alcohol or use illicit drugs. Meds MVI All: NKDA  Review of Systems  Constitutional: Negative.   HENT: Negative.   Eyes: Negative.   Respiratory: Negative.   Cardiovascular: Negative.   Gastrointestinal: Negative.   Genitourinary: Negative.   Musculoskeletal: Negative.   Skin: Negative.   Neurological: Negative.   Psychiatric/Behavioral: Negative.     Dilation: 3 Effacement (%): 80 Station: -1 Exam by:: N Deal  Blood pressure 111/63, pulse 93, temperature 98.5 F (36.9 C), temperature source Oral, resp. rate 20, height 5\' 3"  (1.6 m), weight 74.844 kg (165 lb), last menstrual period 08/18/2010. Maternal Exam:  Uterine Assessment: Contraction strength is moderate.  Abdomen: Fundal height is appropriate for gestation.   Estimated fetal weight is 7#.   Fetal presentation: vertex     Physical Exam  Constitutional: She is oriented to person, place, and time. She appears well-developed and well-nourished.  HENT:  Head:  Normocephalic and atraumatic.  Neck: Normal range of motion. Neck supple. No thyromegaly present.  Cardiovascular: Normal rate and regular rhythm.   Respiratory: Effort normal and breath sounds normal. No respiratory distress.  GI: Soft. Bowel sounds are normal. She exhibits no distension.  Musculoskeletal: Normal range of motion.  Neurological: She is alert and oriented to person, place, and time.  Skin: Skin is warm and dry.  Psychiatric: She has a normal mood and affect. Her behavior is normal.    Prenatal labs: ABO, Rh: --/--/O NEG (08/12 2143) Antibody: Negative (03/27 0049) Rubella: Immune (03/27 0049) RPR: NON REACTIVE (03/27 0100)  HBsAg: Negative (03/27 0049)  HIV: Non-reactive (03/27 0049)  GBS: Negative (03/27 0050)  Hgb 12.6/ Ur Cx neg/ Plt 250K/ Sickle trait/ gc neg, Chl neg, First tri screen too advanced/ glucola 123/ AFP WNL  Dated by 6wk [place] 14 wk viability, cwd EDC 4/10 Nl anat, post plac, female  Assessment/Plan: 25yo G3P1011 at 38 wk with SROM, admitted for augmentation of labor gbbs negative, pitocin to augment Expect SVD   BOVARD,Reinette Cuneo 05/11/2011, 4:54 AM

## 2011-05-11 NOTE — Progress Notes (Signed)
Delivery of live viable female by Dr Jackelyn Knife AGPARS 9,9

## 2011-05-11 NOTE — Anesthesia Procedure Notes (Signed)

## 2011-05-11 NOTE — Anesthesia Postprocedure Evaluation (Signed)
  Anesthesia Post-op Note  Patient: Lauren [name]  Procedure(s) Performed: * No procedures listed *  Patient Location: Mother/Baby  Anesthesia Type: Epidural  Level of Consciousness: awake, alert  and oriented  Airway and Oxygen Therapy: Patient Spontanous Breathing  Post-op Pain: none  Post-op Assessment: Post-op Vital signs reviewed, Patient's Cardiovascular Status Stable, No headache, No backache, No residual numbness and No residual motor weakness  Post-op Vital Signs: Reviewed and stable  Complications: No apparent anesthesia complications

## 2011-05-11 NOTE — Progress Notes (Signed)
Feeling ctx, wants epidural Afeb, VSS FHT- Cat I, ctx not tracing very well on pitocin VE- 5/90/-1, vtx Continue pitocin, monitor progress, will get epidural

## 2011-05-11 NOTE — Anesthesia Preprocedure Evaluation (Addendum)
Anesthesia Evaluation  Patient identified by MRN, date of birth, ID band Patient awake    Reviewed: Allergy & Precautions, H&P , Patient's Chart, lab work & pertinent test results  Airway Mallampati: II TM Distance: >3 FB Neck ROM: full    Dental No notable dental hx. (+) Teeth Intact   Pulmonary  breath sounds clear to auscultation  Pulmonary exam normal       Cardiovascular Exercise Tolerance: Good Rhythm:regular Rate:Normal     Neuro/Psych    GI/Hepatic   Endo/Other    Renal/GU      Musculoskeletal   Abdominal   Peds  Hematology   Anesthesia Other Findings       Reproductive/Obstetrics (+) Pregnancy                           Anesthesia Physical Anesthesia Plan  ASA: II  Anesthesia Plan: Epidural   Post-op Pain Management:    Induction:   Airway Management Planned:   Additional Equipment:   Intra-op Plan:   Post-operative Plan:   Informed Consent: I have reviewed the patients History and Physical, chart, labs and discussed the procedure including the risks, benefits and alternatives for the proposed anesthesia with the patient or authorized representative who has indicated his/her understanding and acceptance.   Dental Advisory Given  Plan Discussed with:   Anesthesia Plan Comments: (Labs checked- platelets confirmed with RN in room. Fetal heart tracing, per RN, reported to be stable enough for sitting procedure. Discussed epidural, and patient consents to the procedure:  included risk of possible headache,backache, failed block, allergic reaction, and nerve injury. This patient was asked if she had any questions or concerns before the procedure started. )        Anesthesia Quick Evaluation

## 2011-05-12 MED ORDER — OXYCODONE-ACETAMINOPHEN 5-325 MG PO TABS: 1.0000 | ORAL_TABLET | ORAL | Status: AC | PRN

## 2011-05-12 NOTE — Progress Notes (Signed)
PPD #1 No problems, wants to go home Afeb, VSS Fundus firm, NT at U-1 Continue routine postpartum care, d/c home if baby can go 

## 2011-05-12 NOTE — Progress Notes (Signed)
UR Chart review completed.  

## 2011-05-12 NOTE — Discharge Instructions (Signed)
As per discharge pamphlet °

## 2011-05-12 NOTE — Discharge Summary (Signed)
Obstetric Discharge Summary Reason for Admission: rupture of membranes Prenatal Procedures: none Intrapartum Procedures: spontaneous vaginal delivery Postpartum Procedures: none Complications-Operative and Postpartum: none Hemoglobin  Date Value Range Status  05/11/2011 13.8  12.0-15.0 (g/dL) Final     HCT  Date Value Range Status  05/11/2011 40.4  36.0-46.0 (%) Final    Physical Exam:  General: alert Lochia: appropriate Uterine Fundus: firm  Discharge Diagnoses: Term Pregnancy-delivered  Discharge Information: Date: 05/12/2011 Activity: pelvic rest Diet: routine Medications: Ibuprofen and Percocet Condition: stable Instructions: refer to practice specific booklet Discharge to: home Follow-up Information    Follow up with Lauren [name] D, MD. Schedule an appointment as soon as possible for a visit in 6 weeks. (Call ASAP to schedule circumcision)    Contact information:   [address] [place], [address] [place] [address] [phone]          Newborn Data: Live born female  Birth Weight: 7 lb 6.9 oz (3370 g) APGAR: 9, 9  Home with mother.  Holman Bonsignore D 05/12/2011, 8:13 AM

## 2011-08-28 ENCOUNTER — Encounter (HOSPITAL_COMMUNITY): Payer: Self-pay

## 2011-08-28 ENCOUNTER — Emergency Department (HOSPITAL_COMMUNITY)
Admission: EM | Admit: 2011-08-28 | Discharge: 2011-08-28 | Disposition: A | Discharge status: Home / Self Care | Payer: Medicaid Other | Source: Home / Self Care | Attending: Emergency Medicine | Admitting: Emergency Medicine

## 2011-08-28 DIAGNOSIS — T783XXA Angioneurotic edema, initial encounter: Secondary | ICD-10-CM

## 2011-08-28 MED ORDER — CIMETIDINE 400 MG PO TABS: 400.0000 mg | ORAL_TABLET | Freq: Two times a day (BID) | ORAL | Status: DC

## 2011-08-28 MED ORDER — DIPHENHYDRAMINE HCL 50 MG/ML IJ SOLN
INTRAMUSCULAR | Status: AC
  Filled 2011-08-28: qty 1

## 2011-08-28 MED ORDER — FEXOFENADINE HCL 180 MG PO TABS: 180.0000 mg | ORAL_TABLET | Freq: Every day | ORAL | Status: DC

## 2011-08-28 MED ORDER — PREDNISONE 10 MG PO TABS: ORAL_TABLET | ORAL | Status: DC

## 2011-08-28 MED ORDER — METHYLPREDNISOLONE SODIUM SUCC 125 MG IJ SOLR
125.0000 mg | Freq: Once | INTRAMUSCULAR | Status: AC
  Administered 2011-08-28: 125 mg via INTRAMUSCULAR

## 2011-08-28 MED ORDER — DIPHENHYDRAMINE HCL 50 MG/ML IJ SOLN
50.0000 mg | Freq: Once | INTRAMUSCULAR | Status: AC
  Administered 2011-08-28: 50 mg via INTRAMUSCULAR

## 2011-08-28 MED ORDER — EPINEPHRINE 0.3 MG/0.3ML IJ DEVI: 0.3000 mg | Freq: Once | INTRAMUSCULAR | Status: DC

## 2011-08-28 MED ORDER — METHYLPREDNISOLONE SODIUM SUCC 125 MG IJ SOLR
INTRAMUSCULAR | Status: AC
  Filled 2011-08-28: qty 2

## 2011-08-28 NOTE — ED Provider Notes (Signed)
Chief Complaint  Patient presents with  . Allergic Reaction    History of Present Illness:   This is a 26 year old female who has had a history since 3 PM yesterday of swelling of her upper lip, right cheek, and her throat feels somewhat scratchy. She denies any swelling of the tongue or difficulty breathing. She hasn't had any hives or any swelling or skin rash elsewhere on her body. She has a history of recurring hives and angioedema beginning in 2005, 8 years ago. She had symptoms for about 3 years, then the symptoms seemed to go away up until today. She had extensive allergy testing and was found to be allergic to 78 out of 100 possible antigens on her allergy test. She was given prednisone and antihistamines. She had at least one episode of anaphylaxis where she had difficulty breathing and go to the hospital. She was given an EpiPen, but this has expired. For the current episode she cannot think of anything that brought this on including any insect bites, foods, new medications, exposures, or systemic symptoms such as fever, chills, or URI symptoms.  Review of Systems:  Other than noted above, the patient denies any of the following symptoms: Systemic:  No fever, chills, sweats, weight loss, or fatigue. ENT:  No nasal congestion, rhinorrhea, sore throat, swelling of lips, tongue or throat. Resp:  No cough, wheezing, or shortness of breath. Skin:  No rash, itching, nodules, or suspicious lesions.  PMFSH:  Past medical history, family history, social history, meds, and allergies were reviewed.  Physical Exam:   Vital signs:  BP 118/78  Pulse 87  Temp 98.1 F (36.7 C) (Oral)  Resp 20  SpO2 99%  Breastfeeding? No Gen:  Alert, oriented, in no distress. ENT:  Pharynx clear, no intraoral lesions, moist mucous membranes. Lungs:  Clear to auscultation. Skin:  Her upper lip is mildly swollen as is her right cheek. Her skin is otherwise clear. No urticaria. There no intraoral lesions. Tongue was  not swollen, pharynx is clear, and the airway is intact.  Course in Urgent Care Center:   She was given Benadryl 25 mg IM and Solu-Medrol 125 mg IM and tolerated these both without any immediate side effects.  Assessment:  The encounter diagnosis was Angioedema.  Plan:   1.  The following meds were prescribed:   New Prescriptions   CIMETIDINE (TAGAMET) 400 MG TABLET    Take 1 tablet (400 mg total) by mouth 2 (two) times daily.   EPINEPHRINE (EPIPEN) 0.3 MG/0.3 ML DEVI    Inject 0.3 mLs (0.3 mg total) into the skin once.   FEXOFENADINE (ALLEGRA) 180 MG TABLET    Take 1 tablet (180 mg total) by mouth daily.   PREDNISONE (DELTASONE) 10 MG TABLET    Take 4 tabs daily for 4 days, 3 tabs daily for 4 days, 2 tabs daily for 4 days, then 1 tab daily for 4 days.   2.  The patient was instructed in symptomatic care and handouts were given. 3.  The patient was told to return if becoming worse in any way, if no better in 3 or 4 days, and given some red flag symptoms that would indicate earlier return.  Follow up:  The patient was told to follow up with her allergist, Dr. Clearence Ped as needed.      Reuben Likes, MD 08/28/11 [phone]

## 2011-08-28 NOTE — ED Notes (Signed)
Pt has hx of allergic reactions and has had extensive testing in 2005 without resolution and pt states last pm she noticed tingling of her face and took a benadryl and today her face is swollen, upper lip is swollen and her throat is itchy.  She states her hands feel sore also which is a new symptom.

## 2011-10-18 ENCOUNTER — Encounter (HOSPITAL_COMMUNITY): Payer: Self-pay | Admitting: *Deleted

## 2011-10-18 ENCOUNTER — Emergency Department (INDEPENDENT_AMBULATORY_CARE_PROVIDER_SITE_OTHER)
Admission: EM | Admit: 2011-10-18 | Discharge: 2011-10-18 | Disposition: A | Discharge status: Home / Self Care | Payer: Self-pay | Source: Home / Self Care

## 2011-10-18 DIAGNOSIS — T63461A Toxic effect of venom of wasps, accidental (unintentional), initial encounter: Secondary | ICD-10-CM

## 2011-10-18 DIAGNOSIS — T6391XA Toxic effect of contact with unspecified venomous animal, accidental (unintentional), initial encounter: Secondary | ICD-10-CM

## 2011-10-18 DIAGNOSIS — IMO0002 Reserved for concepts with insufficient information to code with codable children: Secondary | ICD-10-CM

## 2011-10-18 DIAGNOSIS — T63481A Toxic effect of venom of other arthropod, accidental (unintentional), initial encounter: Secondary | ICD-10-CM

## 2011-10-18 HISTORY — DX: Allergy, unspecified, initial encounter: T78.40XA

## 2011-10-18 MED ORDER — TRIAMCINOLONE ACETONIDE 40 MG/ML IJ SUSP
INTRAMUSCULAR | Status: AC
  Filled 2011-10-18: qty 5

## 2011-10-18 MED ORDER — TRIAMCINOLONE ACETONIDE 0.1 % EX CREA: TOPICAL_CREAM | Freq: Two times a day (BID) | CUTANEOUS | Status: DC

## 2011-10-18 MED ORDER — TRIAMCINOLONE ACETONIDE 40 MG/ML IJ SUSP
40.0000 mg | Freq: Once | INTRAMUSCULAR | Status: AC
  Administered 2011-10-18: 40 mg via INTRAMUSCULAR

## 2011-10-18 NOTE — ED Provider Notes (Signed)
History     CSN: [id number]  Arrival date & time 10/18/11  4098   First MD Initiated Contact with Patient 10/18/11 2022      Chief Complaint  Patient presents with  . Insect Bite    (Consider location/radiation/quality/duration/timing/severity/associated sxs/prior treatment) HPI Comments: Bee sting to R inner elbow 2 d ago. Has annular red and slightly indurated, pruritic area typical of allergic dermal skin reaction. Denies systemic sx's. Hs been taking antihistamines.   The history is provided by the patient.    Past Medical History  Diagnosis Date  . Pregnancy induced hypertension   . Headache   . Sickle cell trait   . Urinary tract infection   . SROM (spontaneous rupture of membranes) 05/11/2011  . Allergic disorder     Past Surgical History  Procedure Date  . No past surgeries   . Therapeutic abortion     No family history on file.  History  Substance Use Topics  . Smoking status: Former Smoker    Quit date: 09/24/2010  . Smokeless tobacco: Not on file  . Alcohol Use: No    OB History    Grav Para Term Preterm Abortions TAB SAB Ect Mult Living   3 2 1 1 1 1  0 0 0 2      Review of Systems  Constitutional: Negative.   HENT: Negative.   Respiratory: Negative.   Gastrointestinal: Negative.   Musculoskeletal: Negative.   Skin: Positive for color change.  Neurological: Negative.     Allergies  Review of patient's allergies indicates no known allergies.  Home Medications   Current Outpatient Rx  Name Route Sig Dispense Refill  . CALCIUM CARBONATE ANTACID 500 MG PO CHEW Oral Chew 1 tablet by mouth daily as needed. For heartburn    . CIMETIDINE 400 MG PO TABS Oral Take 1 tablet (400 mg total) by mouth 2 (two) times daily. 30 tablet 0  . EPINEPHRINE 0.3 MG/0.3ML IJ DEVI Subcutaneous Inject 0.3 mLs (0.3 mg total) into the skin once. 1 Device 12  . FEXOFENADINE HCL 180 MG PO TABS Oral Take 1 tablet (180 mg total) by mouth daily. 15 tablet 0  .  NORETHINDRONE 0.35 MG PO TABS Oral Take 1 tablet by mouth daily.    Marland Kitchen ZOFRAN PO Oral Take 2 mg by mouth 2 (two) times daily as needed. For nausea or vomiting    . PREDNISONE 10 MG PO TABS  Take 4 tabs daily for 4 days, 3 tabs daily for 4 days, 2 tabs daily for 4 days, then 1 tab daily for 4 days. 40 tablet 0  . PRENATAL MULTIVITAMIN CH Oral Take 1 tablet by mouth every morning.     Marland Kitchen PHENERGAN PO Oral Take 1 tablet by mouth 2 (two) times daily as needed. For nausea or vomiting    . TRIAMCINOLONE ACETONIDE 0.1 % EX CREA Topical Apply topically 2 (two) times daily. 15 g 0    BP 105/73  Pulse 96  Temp 98 F (36.7 C) (Oral)  Resp 16  SpO2 99%  LMP 09/22/2011  Physical Exam  Constitutional: She appears well-developed and well-nourished.  Eyes: Pupils are equal, round, and reactive to light.  Neck: Normal range of motion.  Pulmonary/Chest: Effort normal and breath sounds normal.  Musculoskeletal: Normal range of motion.  Skin: Skin is warm. There is erythema.  Psychiatric: She has a normal mood and affect.    ED Course  Procedures (including critical care time)  Labs Reviewed -  No data to display No results found.   1. Hymenoptera sting       MDM  Kenalog 40mg  IM Triamcinolone cream to sting area bid prn Antihistamines q d for next 2-4 days  Cold packs to sting site.        Hayden Rasmussen, NP 10/18/11 2118

## 2011-10-18 NOTE — ED Notes (Signed)
Pt  Reports  She  [place]  [place]  [place]  Ago  E=hen  She  Was  Stung r  Elbow   By  A  Wasp     -  She     Has  Redness  swellng  And  Pain in the  Affected   Area   -   She  Reports  That initially      She  Has  Some  Rash on  Other parts  Of  Body  But that  Subsided

## 2011-10-19 NOTE — ED Provider Notes (Signed)
Medical screening examination/treatment/procedure(s) were performed by resident physician or non-physician practitioner and as supervising physician I was immediately available for consultation/collaboration.   Makita Blow DOUGLAS MD.    Zakaree Mcclenahan D Aldrick Derrig, MD 10/19/11 2131 

## 2012-07-15 ENCOUNTER — Emergency Department (HOSPITAL_COMMUNITY)
Admission: EM | Admit: 2012-07-15 | Discharge: 2012-07-15 | Disposition: A | Discharge status: Home / Self Care | Payer: Self-pay | Source: Home / Self Care | Attending: Family Medicine | Admitting: Family Medicine

## 2012-07-15 ENCOUNTER — Encounter (HOSPITAL_COMMUNITY): Payer: Self-pay | Admitting: *Deleted

## 2012-07-15 DIAGNOSIS — IMO0002 Reserved for concepts with insufficient information to code with codable children: Secondary | ICD-10-CM

## 2012-07-15 DIAGNOSIS — S46912A Strain of unspecified muscle, fascia and tendon at shoulder and upper arm level, left arm, initial encounter: Secondary | ICD-10-CM

## 2012-07-15 MED ORDER — KETOROLAC TROMETHAMINE 60 MG/2ML IM SOLN
INTRAMUSCULAR | Status: AC
  Filled 2012-07-15: qty 2

## 2012-07-15 MED ORDER — NAPROXEN 500 MG PO TABS: 500.0000 mg | ORAL_TABLET | Freq: Two times a day (BID) | ORAL | Status: DC

## 2012-07-15 MED ORDER — KETOROLAC TROMETHAMINE 60 MG/2ML IM SOLN
60.0000 mg | Freq: Once | INTRAMUSCULAR | Status: AC
  Administered 2012-07-15: 60 mg via INTRAMUSCULAR

## 2012-07-15 NOTE — ED Notes (Signed)
Patient complains of left shoulder pain after swimming yesterday. States muscle spasms last night that kept her up; also states headache that started last night. Denies Fever/chills.

## 2012-07-15 NOTE — ED Provider Notes (Signed)
History     CSN: [id number]  Arrival date & time 07/15/12  1622   None     Chief Complaint  Patient presents with  . Shoulder Pain    (Consider location/radiation/quality/duration/timing/severity/associated sxs/prior treatment) HPI Comments: Pt presents c/o should pain and headache since yesterday.  She was in the pool with her 26 year old daughter and last night she started having pain and muscle spasms in the back of her left shoulder.  She also started getting a mild headache last night.  She has had this once before and was told she was having a muscle spasm in her shoulder.  She denies any injury and says she was not even swimming or exercising in any way.  The headache is mild and bilateral across the forehead.  Denies fever, chills, dizziness, NVD, blurry vision.    Patient is a 27 y.o. female presenting with shoulder pain.  Shoulder Pain Associated symptoms include headaches (see HPI). Pertinent negatives include no chest pain, no abdominal pain and no shortness of breath.    Past Medical History  Diagnosis Date  . Pregnancy induced hypertension   . Headache(784.0)   . Sickle cell trait   . Urinary tract infection   . SROM (spontaneous rupture of membranes) 05/11/2011  . Allergic disorder     Past Surgical History  Procedure Laterality Date  . No past surgeries    . Therapeutic abortion      No family history on file.  History  Substance Use Topics  . Smoking status: Former Smoker    Quit date: 09/24/2010  . Smokeless tobacco: Not on file  . Alcohol Use: No    OB History   Grav Para Term Preterm Abortions TAB SAB Ect Mult Living   3 2 1 1 1 1  0 0 0 2      Review of Systems  Constitutional: Negative for fever and chills.  Eyes: Negative for visual disturbance.  Respiratory: Negative for cough and shortness of breath.   Cardiovascular: Negative for chest pain, palpitations and leg swelling.  Gastrointestinal: Negative for nausea, vomiting and abdominal  pain.  Endocrine: Negative for polydipsia and polyuria.  Genitourinary: Negative for dysuria, urgency and frequency.  Musculoskeletal: Positive for arthralgias (left shoulder pain - see HPI). Negative for myalgias.  Skin: Negative for rash.  Neurological: Positive for headaches (see HPI). Negative for dizziness, weakness and light-headedness.    Allergies  Review of patient's allergies indicates no known allergies.  Home Medications   Current Outpatient Rx  Name  Route  Sig  Dispense  Refill  . calcium carbonate (TUMS - DOSED IN MG ELEMENTAL CALCIUM) 500 MG chewable tablet   Oral   Chew 1 tablet by mouth daily as needed. For heartburn         . cimetidine (TAGAMET) 400 MG tablet   Oral   Take 1 tablet (400 mg total) by mouth 2 (two) times daily.   30 tablet   0   . EPINEPHrine (EPIPEN) 0.3 mg/0.3 mL DEVI   Subcutaneous   Inject 0.3 mLs (0.3 mg total) into the skin once.   1 Device   12   . fexofenadine (ALLEGRA) 180 MG tablet   Oral   Take 1 tablet (180 mg total) by mouth daily.   15 tablet   0   . naproxen (NAPROSYN) 500 MG tablet   Oral   Take 1 tablet (500 mg total) by mouth 2 (two) times daily.   60 tablet  0   . norethindrone (MICRONOR,CAMILA,ERRIN) 0.35 MG tablet   Oral   Take 1 tablet by mouth daily.         . Ondansetron HCl (ZOFRAN PO)   Oral   Take 2 mg by mouth 2 (two) times daily as needed. For nausea or vomiting         . predniSONE (DELTASONE) 10 MG tablet      Take 4 tabs daily for 4 days, 3 tabs daily for 4 days, 2 tabs daily for 4 days, then 1 tab daily for 4 days.   40 tablet   0   . Prenatal Vit-Fe Fumarate-FA (PRENATAL MULTIVITAMIN) TABS   Oral   Take 1 tablet by mouth every morning.          . Promethazine HCl (PHENERGAN PO)   Oral   Take 1 tablet by mouth 2 (two) times daily as needed. For nausea or vomiting         . triamcinolone cream (KENALOG) 0.1 %   Topical   Apply topically 2 (two) times daily.   15 g   0      BP 141/91  Pulse 84  Temp(Src) 98.2 F (36.8 C) (Oral)  Resp 14  SpO2 100%  LMP 07/10/2012  Physical Exam  Nursing note and vitals reviewed. Constitutional: She is oriented to person, place, and time. Vital signs are normal. She appears well-developed and well-nourished. No distress.  HENT:  Head: Atraumatic.  Eyes: EOM are normal. Pupils are equal, round, and reactive to light.  Cardiovascular: Normal rate, regular rhythm and normal heart sounds.  Exam reveals no gallop and no friction rub.   No murmur heard. Pulmonary/Chest: Effort normal and breath sounds normal. No respiratory distress. She has no wheezes. She has no rales.  Abdominal: Soft. There is no tenderness.  Musculoskeletal:       Right shoulder: She exhibits tenderness (mild TTP along posterior deltoid ).  Neurological: She is alert and oriented to person, place, and time. She has normal strength.  Skin: Skin is warm and dry. She is not diaphoretic.  Psychiatric: She has a normal mood and affect. Her behavior is normal. Judgment normal.    ED Course  Procedures (including critical care time)  Labs Reviewed - No data to display No results found.   1. Shoulder strain, left, initial encounter       MDM  This is a mild muscle strain.  Will treat with NSAIDs and have her f/u here in a week if not improving, or with PCP.  Advised to keep active and do light stretching activities.      Meds ordered this encounter  Medications  . ketorolac (TORADOL) injection 60 mg    Sig:   . naproxen (NAPROSYN) 500 MG tablet    Sig: Take 1 tablet (500 mg total) by mouth 2 (two) times daily.    Dispense:  60 tablet    Refill:  0       Graylon Good, PA-C 07/15/12 1830

## 2012-07-15 NOTE — ED Provider Notes (Signed)
Medical screening examination/treatment/procedure(s) were performed by resident physician or non-physician practitioner and as supervising physician I was immediately available for consultation/collaboration.   KINDL,JAMES DOUGLAS MD.   James D Kindl, MD 07/15/12 1850 

## 2012-09-10 ENCOUNTER — Inpatient Hospital Stay (HOSPITAL_COMMUNITY)
Admission: AD | Admit: 2012-09-10 | Discharge: 2012-09-10 | Disposition: A | Discharge status: Home / Self Care | Payer: No Typology Code available for payment source | Source: Ambulatory Visit | Attending: Obstetrics and Gynecology | Admitting: Obstetrics and Gynecology

## 2012-09-10 ENCOUNTER — Encounter (HOSPITAL_COMMUNITY): Payer: Self-pay | Admitting: *Deleted

## 2012-09-10 DIAGNOSIS — N750 Cyst of Bartholin's gland: Secondary | ICD-10-CM | POA: Insufficient documentation

## 2012-09-10 HISTORY — DX: Cyst of Bartholin's gland: N75.0

## 2012-09-10 LAB — POCT PREGNANCY, URINE: Preg Test, Ur: NEGATIVE

## 2012-09-10 NOTE — MAU Note (Signed)
[name];

## 2012-09-10 NOTE — MAU Note (Signed)
Hx of several Barhtolin cysts; this cyst was I&Ded last Monday but cyst is bigger and more pain full now; has had 4 cyst drained in past month;

## 2012-09-10 NOTE — MAU Provider Note (Signed)
None     Chief Complaint:  Bartholin's Cyst   BENICIA BERGEVIN is  27 y.o. [id number].  No LMP recorded..  Her pregnancy status is negative.  She presents complaining of Bartholin's Cyst She has been dealing with this off and on for several months, having had it lanced 4 times, once with a Ward catheter (it only lasted one day).  Pt states that today, it was "between the size of a golf and tennis ball." She had put in a call to her GYN this morning, but just decided to come here before hearing from them.  Upon using the bathroom in the MAU, the cyst spontaneously opened up and began draining.    Past Medical History  Diagnosis Date  . Pregnancy induced hypertension   . Headache(784.0)   . Sickle cell trait   . Urinary tract infection   . SROM (spontaneous rupture of membranes) 05/11/2011  . Allergic disorder   . Bartholin cyst     Past Surgical History  Procedure Laterality Date  . Therapeutic abortion      Family History  Problem Relation Age of Onset  . Alcohol abuse Neg Hx   . Arthritis Neg Hx   . Asthma Neg Hx   . Birth defects Neg Hx   . Cancer Neg Hx   . COPD Neg Hx   . Depression Neg Hx   . Diabetes Neg Hx   . Drug abuse Neg Hx   . Early death Neg Hx   . Hearing loss Neg Hx   . Heart disease Neg Hx   . Hyperlipidemia Neg Hx   . Hypertension Neg Hx   . Kidney disease Neg Hx   . Learning disabilities Neg Hx   . Mental illness Neg Hx   . Mental retardation Neg Hx   . Miscarriages / Stillbirths Neg Hx   . Stroke Neg Hx   . Vision loss Neg Hx     History  Substance Use Topics  . Smoking status: Former Smoker    Quit date: 09/24/2010  . Smokeless tobacco: Not on file  . Alcohol Use: No    Allergies: No Known Allergies  Prescriptions prior to admission  Medication Sig Dispense Refill  . calcium carbonate (TUMS - DOSED IN MG ELEMENTAL CALCIUM) 500 MG chewable tablet Chew 1 tablet by mouth daily as needed. For heartburn      . cimetidine (TAGAMET) 400 MG  tablet Take 1 tablet (400 mg total) by mouth 2 (two) times daily.  30 tablet  0  . EPINEPHrine (EPIPEN) 0.3 mg/0.3 mL DEVI Inject 0.3 mLs (0.3 mg total) into the skin once.  1 Device  12  . fexofenadine (ALLEGRA) 180 MG tablet Take 1 tablet (180 mg total) by mouth daily.  15 tablet  0  . naproxen (NAPROSYN) 500 MG tablet Take 1 tablet (500 mg total) by mouth 2 (two) times daily.  60 tablet  0  . norethindrone (MICRONOR,CAMILA,ERRIN) 0.35 MG tablet Take 1 tablet by mouth daily.      . Ondansetron HCl (ZOFRAN PO) Take 2 mg by mouth 2 (two) times daily as needed. For nausea or vomiting      . predniSONE (DELTASONE) 10 MG tablet Take 4 tabs daily for 4 days, 3 tabs daily for 4 days, 2 tabs daily for 4 days, then 1 tab daily for 4 days.  40 tablet  0  . Prenatal Vit-Fe Fumarate-FA (PRENATAL MULTIVITAMIN) TABS Take 1 tablet by mouth every morning.       Marland Kitchen  Promethazine HCl (PHENERGAN PO) Take 1 tablet by mouth 2 (two) times daily as needed. For nausea or vomiting      . triamcinolone cream (KENALOG) 0.1 % Apply topically 2 (two) times daily.  15 g  0     Review of Systems   Constitutional: Negative for fever and chills Eyes: Negative for visual disturbances Respiratory: Negative for shortness of breath, dyspnea Cardiovascular: Negative for chest pain or palpitations  Gastrointestinal: Negative for vomiting, diarrhea and constipation Genitourinary: Negative for dysuria and urgency Musculoskeletal: Negative for back pain, joint pain, myalgias  Neurological: Negative for dizziness and headaches     Physical Exam   Blood pressure 135/81, pulse 100, temperature 99.1 F (37.3 C), temperature source Oral, resp. rate 18.  General: General appearance - alert, well appearing, and in no distress Chest - clear to auscultation, no wheezes, rales or rhonchi, symmetric air entry Heart - normal rate and regular rhythm Abdomen - soft, nontender, nondistended, no masses or organomegaly Pelvic - Left  bartholin cyst, draining purulent/bloody drainage now, about the size of a golf ball.  Gland milked down to the size of a grape, ward catheter inserted and inflated with 2cc lidocaine.  Pt instructed to leave in 4-6 weeks, if possible.  Continue to take clindamycin as previously prescribed Extremities - no pedal edema noted   Assessment: Patient Active Problem List   Diagnosis Date Noted  . SROM (spontaneous rupture of membranes) 05/11/2011  . SVD (spontaneous vaginal delivery) 05/11/2011    Plan: Dr. Ambrose Mantle given report.  Advised for pt to see Dr. Jackelyn Knife later this week.  CRESENZO-DISHMAN,Devaris Quirk

## 2012-09-13 ENCOUNTER — Encounter (HOSPITAL_COMMUNITY): Admission: RE | Payer: Self-pay | Source: Ambulatory Visit

## 2012-09-13 ENCOUNTER — Ambulatory Visit (HOSPITAL_COMMUNITY): Admission: RE | Admit: 2012-09-13 | Payer: Self-pay | Source: Ambulatory Visit | Admitting: Obstetrics and Gynecology

## 2012-09-13 SURGERY — MARSUPIALIZATION, CYST, BARTHOLIN'S GLAND
Anesthesia: Choice | Laterality: Left

## 2012-12-02 ENCOUNTER — Emergency Department (INDEPENDENT_AMBULATORY_CARE_PROVIDER_SITE_OTHER)
Admission: EM | Admit: 2012-12-02 | Discharge: 2012-12-02 | Disposition: A | Discharge status: Home / Self Care | Payer: No Typology Code available for payment source | Source: Home / Self Care | Attending: Emergency Medicine | Admitting: Emergency Medicine

## 2012-12-02 ENCOUNTER — Encounter (HOSPITAL_COMMUNITY): Payer: Self-pay | Admitting: Emergency Medicine

## 2012-12-02 DIAGNOSIS — Z3481 Encounter for supervision of other normal pregnancy, first trimester: Secondary | ICD-10-CM

## 2012-12-02 DIAGNOSIS — J040 Acute laryngitis: Secondary | ICD-10-CM

## 2012-12-02 HISTORY — DX: Irregular menstruation, unspecified: N92.6

## 2012-12-02 LAB — POCT RAPID STREP A: Streptococcus, Group A Screen (Direct): NEGATIVE

## 2012-12-02 MED ORDER — PRENATAL VITAMINS 28-0.8 MG PO TABS: 1.0000 | ORAL_TABLET | Freq: Every day | ORAL | Status: DC

## 2012-12-02 NOTE — ED Provider Notes (Signed)
Chief Complaint:   Chief Complaint  Patient presents with  . Sore Throat    History of Present Illness:   Lauren [name] is a 27 year old female who has had a two-day history of sore throat, hoarseness, nasal congestion, and dry cough. She denies fever, chills, headache, purulent nasal drainage, sinus pain or pressure, swollen glands, wheezing, chest pain, or GI symptoms. She has been exposed to strep recently. She also wants to have a pregnancy test done since she's had irregular menses.  Review of Systems:  Other than noted above, the patient denies any of the following symptoms: Systemic:  No fevers, chills, sweats, weight loss or gain, fatigue, or tiredness. Eye:  No redness or discharge. ENT:  No ear pain, drainage, headache, nasal congestion, drainage, sinus pressure, difficulty swallowing, or sore throat. Neck:  No neck pain or swollen glands. Lungs:  No cough, sputum production, hemoptysis, wheezing, chest tightness, shortness of breath or chest pain. GI:  No abdominal pain, nausea, vomiting or diarrhea.  PMFSH:  Past medical history, family history, social history, meds, and allergies were reviewed.   Physical Exam:   Vital signs:  BP 128/85  Pulse 92  Temp(Src) 98.1 F (36.7 C) (Oral)  Resp 16  SpO2 100%  LMP 10/14/2012 General:  Alert and oriented.  In no distress.  Skin warm and dry. Eye:  No conjunctival injection or drainage. Lids were normal. ENT:  TMs and canals were normal, without erythema or inflammation.  Nasal mucosa was clear and uncongested, without drainage.  Mucous membranes were moist.  Pharynx was clear with no exudate or drainage.  There were no oral ulcerations or lesions. Neck:  Supple, no adenopathy, tenderness or mass. Lungs:  No respiratory distress.  Lungs were clear to auscultation, without wheezes, rales or rhonchi.  Breath sounds were clear and equal bilaterally.  Heart:  Regular rhythm, without gallops, murmers or rubs. Skin:  Clear, warm, and  dry, without rash or lesions.  Labs:   Results for orders placed during the hospital encounter of 12/02/12  POCT PREGNANCY, URINE      Result Value Range   Preg Test, Ur POSITIVE (*) NEGATIVE  POCT RAPID STREP A ([facility] CARE ONLY)      Result Value Range   Streptococcus, Group A Screen (Direct) NEGATIVE  NEGATIVE    Assessment:  The primary encounter diagnosis was Laryngitis. A diagnosis of Pregnancy, normal subsequent, first trimester was also pertinent to this visit.  Her respiratory infection seems to be a viral one, but she is pregnant. She was informed of this result and will contact her obstetrician as soon as possible. She was given a prescription for prenatal vitamins. If she has any pregnancy related complications, should go directly to [facility]. Since she is pregnant, will take a conservative approach of URI symptoms with hot salt water gargles, lozenges, and over-the-counter antihistamines if needed.  Plan:   1.  Meds:  The following meds were prescribed:   Discharge Medication List as of 12/02/2012 11:50 AM    START taking these medications   Details  Prenatal Vit-Fe Fumarate-FA (PRENATAL VITAMINS) 28-0.8 MG TABS Take 1 tablet by mouth daily., Starting 12/02/2012, Until Discontinued, Normal        2.  Patient Education/Counseling:  The patient was given appropriate handouts, self care instructions, and instructed in symptomatic relief.    3.  Follow up:  The patient was told to follow up if no better in 3 to 4 days, if becoming worse  in any way, and given some red flag symptoms such as fever or respiratory distress which would prompt immediate return.  Follow up here as needed for respiratory symptoms and at [facility] for any pregnancy related complication.      Reuben Likes, MD 12/02/12 2229

## 2012-12-02 NOTE — ED Notes (Signed)
C/O sore throat since yesterday.  Woke at 0300 this morning with severe sore throat, painful swallowing, nasal congestion, and slight dry cough.  Unsure if fevers.  Has been performing oral rinses.

## 2012-12-02 NOTE — ED Notes (Signed)
States sister diagnosed with strep last week.

## 2012-12-04 LAB — CULTURE, GROUP A STREP

## 2013-01-22 ENCOUNTER — Encounter: Payer: Self-pay | Admitting: Advanced Practice Midwife

## 2013-01-22 ENCOUNTER — Ambulatory Visit (INDEPENDENT_AMBULATORY_CARE_PROVIDER_SITE_OTHER): Payer: Medicaid Other | Admitting: Advanced Practice Midwife

## 2013-01-22 VITALS — BP 128/76 | Temp 97.8°F | Wt 162.2 lb

## 2013-01-22 DIAGNOSIS — O09892 Supervision of other high risk pregnancies, second trimester: Secondary | ICD-10-CM

## 2013-01-22 DIAGNOSIS — O0932 Supervision of pregnancy with insufficient antenatal care, second trimester: Secondary | ICD-10-CM | POA: Insufficient documentation

## 2013-01-22 DIAGNOSIS — O0930 Supervision of pregnancy with insufficient antenatal care, unspecified trimester: Secondary | ICD-10-CM

## 2013-01-22 DIAGNOSIS — O26899 Other specified pregnancy related conditions, unspecified trimester: Secondary | ICD-10-CM | POA: Insufficient documentation

## 2013-01-22 DIAGNOSIS — O09299 Supervision of pregnancy with other poor reproductive or obstetric history, unspecified trimester: Secondary | ICD-10-CM

## 2013-01-22 DIAGNOSIS — O360121 Maternal care for anti-D [Rh] antibodies, second trimester, fetus 1: Secondary | ICD-10-CM

## 2013-01-22 DIAGNOSIS — O09292 Supervision of pregnancy with other poor reproductive or obstetric history, second trimester: Secondary | ICD-10-CM

## 2013-01-22 DIAGNOSIS — O360990 Maternal care for other rhesus isoimmunization, unspecified trimester, not applicable or unspecified: Secondary | ICD-10-CM

## 2013-01-22 DIAGNOSIS — O09899 Supervision of other high risk pregnancies, unspecified trimester: Secondary | ICD-10-CM | POA: Insufficient documentation

## 2013-01-22 DIAGNOSIS — Z6791 Unspecified blood type, Rh negative: Secondary | ICD-10-CM | POA: Insufficient documentation

## 2013-01-22 LAB — POCT URINALYSIS DIP (DEVICE)
Hgb urine dipstick: NEGATIVE
Nitrite: NEGATIVE
Protein, ur: NEGATIVE mg/dL
Specific Gravity, Urine: 1.025 (ref 1.005–1.030)
Urobilinogen, UA: 0.2 mg/dL (ref 0.0–1.0)

## 2013-01-22 MED ORDER — ONDANSETRON HCL 4 MG PO TABS: 4.0000 mg | ORAL_TABLET | Freq: Three times a day (TID) | ORAL | Status: DC | PRN

## 2013-01-22 MED ORDER — PRENATAL VITAMINS 0.8 MG PO TABS: 1.0000 | ORAL_TABLET | Freq: Every day | ORAL | Status: DC

## 2013-01-22 MED ORDER — FLUCONAZOLE 150 MG PO TABS: 150.0000 mg | ORAL_TABLET | Freq: Once | ORAL | Status: DC

## 2013-01-22 MED ORDER — BUTALBITAL-APAP-CAFFEINE 50-325-40 MG PO TABS: 1.0000 | ORAL_TABLET | Freq: Four times a day (QID) | ORAL | Status: DC | PRN

## 2013-01-22 NOTE — Progress Notes (Signed)
P= 100 Pt. C/o of intermittent lower abdominal/pelvic pressure.  C/o of white itchy discharge for the last two weeks.  C/o of headaches, constant for the last two weeks. Discussed appropriate weight gain based on BMI for this pregnancy(15-25lb); pt. Verbalized understanding.  New OB packet given to patient.

## 2013-01-22 NOTE — Progress Notes (Signed)
New OB. See other note.   Not prepared to do glucola today. (had diabetes with other pregnancy) NEEDS GLUCOLA (OK to use 18 large Brach Jelly beans)  AND QUAD SCREEN AT NEXT VISIT  Subjective:    Lauren [name] is a [id number] [redacted]w[redacted]d being seen today for her first obstetrical visit.  Her obstetrical history is significant for obesity and Unsure Dates, history of Hypertension and Diabetes with previous pregnancy. Patient does intend to breast feed. Pregnancy history fully reviewed.  Patient reports vaginal itching, daily headaches and nausea. Able to eat some things, but not all, Requests Zofran. Has history of migraines, doesn't remember what she used in past.    Filed Vitals:   01/22/13 1356  BP: 128/76  Temp: 97.8 F (36.6 C)  Weight: 162 lb 3.2 oz (73.573 kg)    HISTORY: OB History  Gravida Para Term Preterm AB SAB TAB Ectopic Multiple Living  4 2 1 1 1  0 1 0 0 2    # Outcome Date GA Lbr Len/2nd Weight Sex Delivery Anes PTL Lv  4 CUR           3 TRM 05/11/11 [redacted]w[redacted]d 11:10 / 00:22 7 lb 6.9 oz (3.37 kg) M SVD EPI  Y  2 PRE 06/02/03 [redacted]w[redacted]d   F SVD EPI  Y     Comments: induced for high blood pressure; borderline diabetic  1 TAB              Past Medical History  Diagnosis Date  . Pregnancy induced hypertension   . Headache(784.0)   . Sickle cell trait   . Urinary tract infection   . SROM (spontaneous rupture of membranes) 05/11/2011  . Allergic disorder   . Bartholin cyst   . Irregular menstrual cycle    Past Surgical History  Procedure Laterality Date  . Therapeutic abortion     Family History  Problem Relation Age of Onset  . Alcohol abuse Neg Hx   . Arthritis Neg Hx   . Asthma Neg Hx   . Birth defects Neg Hx   . Cancer Neg Hx   . COPD Neg Hx   . Depression Neg Hx   . Diabetes Neg Hx   . Drug abuse Neg Hx   . Early death Neg Hx   . Hearing loss Neg Hx   . Heart disease Neg Hx   . Hyperlipidemia Neg Hx   . Hypertension Neg Hx   . Kidney disease Neg Hx   .  Learning disabilities Neg Hx   . Mental illness Neg Hx   . Mental retardation Neg Hx   . Miscarriages / Stillbirths Neg Hx   . Stroke Neg Hx   . Vision loss Neg Hx      Exam    Uterus:     Pelvic Exam:    Perineum: No Hemorrhoids   Vulva: Bartholin's, Urethra, Skene's normal, No erethema   Vagina:  normal mucosa, normal discharge   pH:    Cervix: multiparous appearance   Adnexa: normal adnexa and no mass, fullness, tenderness   Bony Pelvis: gynecoid  System: Breast:  normal appearance, no masses or tenderness   Skin: normal coloration and turgor, no rashes    Neurologic: oriented, grossly non-focal   Extremities: normal strength, tone, and muscle mass   HEENT neck supple with midline trachea   Mouth/Teeth mucous membranes moist, pharynx normal without lesions   Neck supple and no masses   Cardiovascular: regular rate and rhythm  Respiratory:  appears well, vitals normal, no respiratory distress, acyanotic, normal RR, ear and throat exam is normal, neck free of mass or lymphadenopathy, chest clear, no wheezing, crepitations, rhonchi, normal symmetric air entry   Abdomen: soft, non-tender; bowel sounds normal; no masses,  no organomegaly   Urinary: urethral meatus normal      Assessment:    Pregnancy: [id number] Patient Active Problem List   Diagnosis Date Noted  . Rh negative status during pregnancy 01/22/2013  . Late prenatal care complicating pregnancy in second trimester 01/22/2013  . H/O gestational diabetes in prior pregnancy, currently pregnant 01/22/2013  . Prior pregnancy complicated by [facility], antepartum 01/22/2013  . SROM (spontaneous rupture of membranes) 05/11/2011  . SVD (spontaneous vaginal delivery) 05/11/2011        Plan:     Initial labs drawn. Prenatal vitamins. Problem list reviewed and updated. Genetic Screening discussed Quad Screen: requested.  Ultrasound discussed; fetal survey: Ordered for dating.  Follow up in 4 weeks. 50% of 30 min visit  spent on counseling and coordination of care.   Will do Quad Screen and glucola at next visit. States glucola makes her vomit. Offered Jelly bean method, accepts. Told her to bring large Brach jelly beans, will take 18. Too late for first screen so will do Quad at next visit.    Rx PNV, Zofran and Diflucan.  [facility] 01/22/2013

## 2013-01-22 NOTE — Patient Instructions (Signed)
Second Trimester of Pregnancy The second trimester is from week 13 through week 28, months 4 through 6. The second trimester is often a time when you feel your best. Your body has also adjusted to being pregnant, and you begin to feel better physically. Usually, morning sickness has lessened or quit completely, you may have more energy, and you may have an increase in appetite. The second trimester is also a time when the fetus is growing rapidly. At the end of the sixth month, the fetus is about 9 inches long and weighs about 1 pounds. You will likely begin to feel the baby move (quickening) between 18 and 20 weeks of the pregnancy. BODY CHANGES Your body goes through many changes during pregnancy. The changes vary from woman to woman.   Your weight will continue to increase. You will notice your lower abdomen bulging out.  You may begin to get stretch marks on your hips, abdomen, and breasts.  You may develop headaches that can be relieved by medicines approved by your caregiver.  You may urinate more often because the fetus is pressing on your bladder.  You may develop or continue to have heartburn as a result of your pregnancy.  You may develop constipation because certain hormones are causing the muscles that push waste through your intestines to slow down.  You may develop hemorrhoids or swollen, bulging veins (varicose veins).  You may have back pain because of the weight gain and pregnancy hormones relaxing your joints between the bones in your pelvis and as a result of a shift in weight and the muscles that support your balance.  Your breasts will continue to grow and be tender.  Your gums may bleed and may be sensitive to brushing and flossing.  Dark spots or blotches (chloasma, mask of pregnancy) may develop on your face. This will likely fade after the baby is born.  A dark line from your belly button to the pubic area (linea nigra) may appear. This will likely fade after the  baby is born. WHAT TO EXPECT AT YOUR PRENATAL VISITS During a routine prenatal visit:  You will be weighed to make sure you and the fetus are growing normally.  Your blood pressure will be taken.  Your abdomen will be measured to track your baby's growth.  The fetal heartbeat will be listened to.  Any test results from the previous visit will be discussed. Your caregiver may ask you:  How you are feeling.  If you are feeling the baby move.  If you have had any abnormal symptoms, such as leaking fluid, bleeding, severe headaches, or abdominal cramping.  If you have any questions. Other tests that may be performed during your second trimester include:  Blood tests that check for:  Low iron levels (anemia).  Gestational diabetes (between 24 and 28 weeks).  Rh antibodies.  Urine tests to check for infections, diabetes, or protein in the urine.  An ultrasound to confirm the proper growth and development of the baby.  An amniocentesis to check for possible genetic problems.  Fetal screens for spina bifida and Down syndrome. HOME CARE INSTRUCTIONS   Avoid all smoking, herbs, alcohol, and unprescribed drugs. These chemicals affect the formation and growth of the baby.  Follow your caregiver's instructions regarding medicine use. There are medicines that are either safe or unsafe to take during pregnancy.  Exercise only as directed by your caregiver. Experiencing uterine cramps is a good sign to stop exercising.  Continue to eat regular,   healthy meals.  Wear a good support bra for breast tenderness.  Do not use hot tubs, steam rooms, or saunas.  Wear your seat belt at all times when driving.  Avoid raw meat, uncooked cheese, cat litter boxes, and soil used by cats. These carry germs that can cause birth defects in the baby.  Take your prenatal vitamins.  Try taking a stool softener (if your caregiver approves) if you develop constipation. Eat more high-fiber foods,  such as fresh vegetables or fruit and whole grains. Drink plenty of fluids to keep your urine clear or pale yellow.  Take warm sitz baths to soothe any pain or discomfort caused by hemorrhoids. Use hemorrhoid cream if your caregiver approves.  If you develop varicose veins, wear support hose. Elevate your feet for 15 minutes, 3 4 times a day. Limit salt in your diet.  Avoid heavy lifting, wear low heel shoes, and practice good posture.  Rest with your legs elevated if you have leg cramps or low back pain.  Visit your dentist if you have not gone yet during your pregnancy. Use a soft toothbrush to brush your teeth and be gentle when you floss.  A sexual relationship may be continued unless your caregiver directs you otherwise.  Continue to go to all your prenatal visits as directed by your caregiver. SEEK MEDICAL CARE IF:   You have dizziness.  You have mild pelvic cramps, pelvic pressure, or nagging pain in the abdominal area.  You have persistent nausea, vomiting, or diarrhea.  You have a bad smelling vaginal discharge.  You have pain with urination. SEEK IMMEDIATE MEDICAL CARE IF:   You have a fever.  You are leaking fluid from your vagina.  You have spotting or bleeding from your vagina.  You have severe abdominal cramping or pain.  You have rapid weight gain or loss.  You have shortness of breath with chest pain.  You notice sudden or extreme swelling of your face, hands, ankles, feet, or legs.  You have not felt your baby move in over an hour.  You have severe headaches that do not go away with medicine.  You have vision changes. Document Released: 01/25/2001 Document Revised: 10/03/2012 Document Reviewed: 04/03/2012 Exit[employer] Patient Information 2014 [employer].  

## 2013-01-23 LAB — PRESCRIPTION MONITORING PROFILE (19 PANEL)
Barbiturate Screen, Urine: NEGATIVE ng/mL
Cannabinoid Scrn, Ur: NEGATIVE ng/mL
Cocaine Metabolites: NEGATIVE ng/mL
Creatinine, Urine: 140.18 mg/dL (ref >20.0)
Fentanyl, Ur: NEGATIVE ng/mL
MDMA URINE: NEGATIVE ng/mL
Opiate Screen, Urine: NEGATIVE ng/mL
Oxycodone Screen, Ur: NEGATIVE ng/mL
Phencyclidine, Ur: NEGATIVE ng/mL
Zolpidem, Urine: NEGATIVE ng/mL

## 2013-01-23 LAB — OBSTETRIC PANEL
Basophils Absolute: 0 10*3/uL (ref 0.0–0.1)
Basophils Relative: 0 % (ref 0–1)
Eosinophils Absolute: 0.1 10*3/uL (ref 0.0–0.7)
Eosinophils Relative: 1 % (ref 0–5)
MCH: 28.1 pg (ref 26.0–34.0)
MCHC: 34.8 g/dL (ref 30.0–36.0)
MCV: 80.9 fL (ref 78.0–100.0)
Neutrophils Relative %: 65 % (ref 43–77)
Platelets: 275 10*3/uL (ref 150–400)
RDW: 14.6 % (ref 11.5–15.5)

## 2013-01-23 LAB — HIV ANTIBODY (ROUTINE TESTING W REFLEX): HIV: NONREACTIVE

## 2013-01-23 LAB — COMPREHENSIVE METABOLIC PANEL
ALT: 12 U/L (ref 0–35)
AST: 12 U/L (ref 0–37)
Albumin: 3.6 g/dL (ref 3.5–5.2)
Calcium: 9.2 mg/dL (ref 8.4–10.5)
Chloride: 103 mEq/L (ref 96–112)
Potassium: 3.7 mEq/L (ref 3.5–5.3)

## 2013-01-23 LAB — WET PREP, GENITAL
Clue Cells Wet Prep HPF POC: NONE SEEN
WBC, Wet Prep HPF POC: NONE SEEN

## 2013-01-25 ENCOUNTER — Other Ambulatory Visit: Payer: Self-pay | Admitting: Advanced Practice Midwife

## 2013-01-25 ENCOUNTER — Encounter: Payer: Self-pay | Admitting: Advanced Practice Midwife

## 2013-01-25 DIAGNOSIS — O2342 Unspecified infection of urinary tract in pregnancy, second trimester: Secondary | ICD-10-CM

## 2013-01-25 DIAGNOSIS — Z23 Encounter for immunization: Secondary | ICD-10-CM | POA: Insufficient documentation

## 2013-01-25 DIAGNOSIS — O2340 Unspecified infection of urinary tract in pregnancy, unspecified trimester: Secondary | ICD-10-CM | POA: Insufficient documentation

## 2013-01-25 MED ORDER — CEPHALEXIN 500 MG PO CAPS: 500.0000 mg | ORAL_CAPSULE | Freq: Three times a day (TID) | ORAL | Status: DC

## 2013-01-26 LAB — CULTURE, OB URINE: Colony Count: >=100000

## 2013-01-28 ENCOUNTER — Telehealth: Payer: Self-pay

## 2013-01-28 NOTE — Telephone Encounter (Signed)
Called pt. And informed her of UTI and that antibiotic Keflex is waiting for her at her [facility] pharmacy. Pt. Verbalized understanding and stated sometimes she gets a yeast infection with antibiotics. Informed pt. If that happens she can give [place] a call and we can get her Diflucan ordered. Pt. Verbalized understanding and had no other questions or concerns.

## 2013-01-28 NOTE — Telephone Encounter (Signed)
Message copied by Louanna Raw on Mon Jan 28, 2013 10:27 AM ------      Message from: [facility]s      Created: Fri Jan 25, 2013 11:28 PM      Regarding: UTI       New OB urine culture showed UTI            Rx Keflex order put in            Can you notify her please?            Thanks             Wynelle Bourgeois CNM ------

## 2013-01-30 ENCOUNTER — Ambulatory Visit (HOSPITAL_COMMUNITY)
Admission: RE | Admit: 2013-01-30 | Discharge: 2013-01-30 | Disposition: A | Discharge status: Home / Self Care | Payer: Medicaid Other | Source: Ambulatory Visit | Attending: Advanced Practice Midwife | Admitting: Advanced Practice Midwife

## 2013-01-30 ENCOUNTER — Other Ambulatory Visit: Payer: Self-pay | Admitting: Advanced Practice Midwife

## 2013-01-30 DIAGNOSIS — O360121 Maternal care for anti-D [Rh] antibodies, second trimester, fetus 1: Secondary | ICD-10-CM

## 2013-01-30 DIAGNOSIS — O0932 Supervision of pregnancy with insufficient antenatal care, second trimester: Secondary | ICD-10-CM

## 2013-01-30 DIAGNOSIS — Z3689 Encounter for other specified antenatal screening: Secondary | ICD-10-CM | POA: Insufficient documentation

## 2013-01-30 DIAGNOSIS — O0930 Supervision of pregnancy with insufficient antenatal care, unspecified trimester: Secondary | ICD-10-CM | POA: Insufficient documentation

## 2013-01-31 ENCOUNTER — Encounter: Payer: Self-pay | Admitting: Advanced Practice Midwife

## 2013-02-14 NOTE — L&D Delivery Note (Signed)
Delivery Note At 12:22 PM a viable female was delivered via  (Presentation: LOA).   Placenta status: Spontaneous via Veatrice Kells .  Cord:  with the following complications:none, 3 vessels .    Anesthesia: Epidural   Episiotomy: None Lacerations: None Suture Repair: n/a Est. Blood Loss (mL): 200 ml  Mom to postpartum.  Baby to Couplet care / Skin to Skin.  Antionette Char 07/15/2013, 12:33 PM

## 2013-02-18 ENCOUNTER — Telehealth: Payer: Self-pay | Admitting: General Practice

## 2013-02-18 NOTE — Telephone Encounter (Signed)
Patient called and left message stating she would like a Rx for a yeast infection. Called patient stating I was returning her phone call. Patient stated that she has a real bad yeast infection since taking those antibiotics, which usually happens to her. Informed patient of previous Rx for diflucan and refill.patient was not aware and stated she would give the pharmacy a call. Patient had no further questions

## 2013-02-20 ENCOUNTER — Ambulatory Visit (INDEPENDENT_AMBULATORY_CARE_PROVIDER_SITE_OTHER): Payer: Medicaid Other | Admitting: Advanced Practice Midwife

## 2013-02-20 VITALS — BP 124/87 | Temp 97.0°F | Wt 162.1 lb

## 2013-02-20 DIAGNOSIS — O360990 Maternal care for other rhesus isoimmunization, unspecified trimester, not applicable or unspecified: Secondary | ICD-10-CM

## 2013-02-20 DIAGNOSIS — Z3480 Encounter for supervision of other normal pregnancy, unspecified trimester: Secondary | ICD-10-CM

## 2013-02-20 DIAGNOSIS — O0930 Supervision of pregnancy with insufficient antenatal care, unspecified trimester: Secondary | ICD-10-CM

## 2013-02-20 DIAGNOSIS — O09299 Supervision of pregnancy with other poor reproductive or obstetric history, unspecified trimester: Secondary | ICD-10-CM

## 2013-02-20 LAB — POCT URINALYSIS DIP (DEVICE)
BILIRUBIN URINE: NEGATIVE
GLUCOSE, UA: NEGATIVE mg/dL
HGB URINE DIPSTICK: NEGATIVE
KETONES UR: NEGATIVE mg/dL
Leukocytes, UA: NEGATIVE
NITRITE: NEGATIVE
PH: 5.5 (ref 5.0–8.0)
Protein, ur: NEGATIVE mg/dL
SPECIFIC GRAVITY, URINE: 1.02 (ref 1.005–1.030)
Urobilinogen, UA: 0.2 mg/dL (ref 0.0–1.0)

## 2013-02-20 NOTE — Progress Notes (Signed)
Pulse- 108 Patient tried early one hour today, but vomited- patient is to come back this week for 1 hr

## 2013-02-20 NOTE — Progress Notes (Signed)
Doing well.  Good fetal movement, denies vaginal bleeding, LOF, regular contractions.  Reports some irregular cramping since she vomited glucose drink today.  Will go home and drink water/eat.  Reviewed warning signs, reasons to go to MAU. Pt unsure of LMP, only limited U/S performed, no estimated due date.  Anatomy scan ordered, fundal height 2cm below umbilicus, consistent with LMP today.   GTT rescheduled, pt to bring jelly beans.  Will need to do test with jelly beans if 28 week test done also.

## 2013-02-21 ENCOUNTER — Other Ambulatory Visit: Payer: Self-pay

## 2013-02-21 ENCOUNTER — Ambulatory Visit (HOSPITAL_COMMUNITY)
Admission: RE | Admit: 2013-02-21 | Discharge: 2013-02-21 | Disposition: A | Discharge status: Home / Self Care | Payer: Medicaid Other | Source: Ambulatory Visit | Attending: Advanced Practice Midwife | Admitting: Advanced Practice Midwife

## 2013-02-21 DIAGNOSIS — O0932 Supervision of pregnancy with insufficient antenatal care, second trimester: Secondary | ICD-10-CM

## 2013-02-21 DIAGNOSIS — Z3689 Encounter for other specified antenatal screening: Secondary | ICD-10-CM | POA: Insufficient documentation

## 2013-02-21 DIAGNOSIS — Z3480 Encounter for supervision of other normal pregnancy, unspecified trimester: Secondary | ICD-10-CM

## 2013-03-19 ENCOUNTER — Encounter: Payer: Self-pay | Admitting: Obstetrics and Gynecology

## 2013-03-19 ENCOUNTER — Ambulatory Visit (INDEPENDENT_AMBULATORY_CARE_PROVIDER_SITE_OTHER): Payer: Medicaid Other | Admitting: Obstetrics and Gynecology

## 2013-03-19 VITALS — BP 128/84 | Temp 98.1°F | Wt 165.5 lb

## 2013-03-19 DIAGNOSIS — O360990 Maternal care for other rhesus isoimmunization, unspecified trimester, not applicable or unspecified: Secondary | ICD-10-CM

## 2013-03-19 DIAGNOSIS — N750 Cyst of Bartholin's gland: Secondary | ICD-10-CM | POA: Insufficient documentation

## 2013-03-19 DIAGNOSIS — O0930 Supervision of pregnancy with insufficient antenatal care, unspecified trimester: Secondary | ICD-10-CM

## 2013-03-19 DIAGNOSIS — Z6791 Unspecified blood type, Rh negative: Secondary | ICD-10-CM

## 2013-03-19 DIAGNOSIS — O0932 Supervision of pregnancy with insufficient antenatal care, second trimester: Secondary | ICD-10-CM

## 2013-03-19 DIAGNOSIS — O26899 Other specified pregnancy related conditions, unspecified trimester: Secondary | ICD-10-CM

## 2013-03-19 LAB — POCT URINALYSIS DIP (DEVICE)
Bilirubin Urine: NEGATIVE
GLUCOSE, UA: NEGATIVE mg/dL
Ketones, ur: NEGATIVE mg/dL
Leukocytes, UA: NEGATIVE
Nitrite: NEGATIVE
Protein, ur: NEGATIVE mg/dL
SPECIFIC GRAVITY, URINE: 1.02 (ref 1.005–1.030)
UROBILINOGEN UA: 0.2 mg/dL (ref 0.0–1.0)
pH: 5.5 (ref 5.0–8.0)

## 2013-03-19 MED ORDER — BUTALBITAL-APAP-CAFFEINE 50-325-40 MG PO TABS: 1.0000 | ORAL_TABLET | Freq: Four times a day (QID) | ORAL | Status: DC | PRN

## 2013-03-19 NOTE — Addendum Note (Signed)
Addended by: Franchot MimesALFARO, Thatiana Renbarger on: 03/19/2013 03:44 PM   Modules accepted: Orders

## 2013-03-19 NOTE — Patient Instructions (Addendum)
Second Trimester of Pregnancy The second trimester is from week 13 through week 28, month 4 through 6. This is often the time in pregnancy that you feel your best. Often times, morning sickness has lessened or quit. You may have more energy, and you may get hungry more often. Your unborn baby (fetus) is growing rapidly. At the end of the sixth month, he or she is about 9 inches long and weighs about 1 pounds. You will likely feel the baby move (quickening) between 18 and 20 weeks of pregnancy. HOME CARE   Avoid all smoking, herbs, and alcohol. Avoid drugs not approved by your doctor.  Only take medicine as told by your doctor. Some medicines are safe and some are not during pregnancy.  Exercise only as told by your doctor. Stop exercising if you start having cramps.  Eat regular, healthy meals.  Wear a good support bra if your breasts are tender.  Do not use hot tubs, steam rooms, or saunas.  Wear your seat belt when driving.  Avoid raw meat, uncooked cheese, and liter boxes and soil used by cats.  Take your prenatal vitamins.  Try taking medicine that helps you poop (stool softener) as needed, and if your doctor approves. Eat more fiber by eating fresh fruit, vegetables, and whole grains. Drink enough fluids to keep your pee (urine) clear or pale yellow.  Take warm water baths (sitz baths) to soothe pain or discomfort caused by hemorrhoids. Use hemorrhoid cream if your doctor approves.  If you have puffy, bulging veins (varicose veins), wear support hose. Raise (elevate) your feet for 15 minutes, 3 4 times a day. Limit salt in your diet.  Avoid heavy lifting, wear low heals, and sit up straight.  Rest with your legs raised if you have leg cramps or low back pain.  Visit your dentist if you have not gone during your pregnancy. Use a soft toothbrush to brush your teeth. Be gentle when you floss.  You can have sex (intercourse) unless your doctor tells you not to.  Go to your  doctor visits. GET HELP IF:   You feel dizzy.  You have mild cramps or pressure in your lower belly (abdomen).  You have a nagging pain in your belly area.  You continue to feel sick to your stomach (nauseous), throw up (vomit), or have watery poop (diarrhea).  You have bad smelling fluid coming from your vagina.  You have pain with peeing (urination). GET HELP RIGHT AWAY IF:   You have a fever.  You are leaking fluid from your vagina.  You have spotting or bleeding from your vagina.  You have severe belly cramping or pain.  You lose or gain weight rapidly.  You have trouble catching your breath and have chest pain.  You notice sudden or extreme puffiness (swelling) of your face, hands, ankles, feet, or legs.  You have not felt the baby move in over an hour.  You have severe headaches that do not go away with medicine.  You have vision changes. Document Released: 04/27/2009 Document Revised: 05/28/2012 Document Reviewed: 04/03/2012 [facility] Patient Information 2014 [place], [place]. Bartholin's Cyst or Abscess Bartholin's glands are small glands located within the folds of skin (labia) along the sides of the lower opening of the vagina (birth canal). A cyst may develop when the duct of the gland becomes blocked. When this happens, fluid that accumulates within the cyst can become infected. This is known as an abscess. The Bartholin gland produces a mucous  fluid to lubricate the outside of the vagina during sexual intercourse. SYMPTOMS   Patients with a small cyst may not have any symptoms.  Mild discomfort to severe pain depending on the size of the cyst and if it is infected (abscess).  Pain, redness, and swelling around the lower opening of the vagina.  Painful intercourse.  Pressure in the perineal area.  Swelling of the lips of the vagina (labia).  The cyst or abscess can be on one side or both sides of the vagina. DIAGNOSIS   A large swelling is seen in the  lower vagina area by your caregiver.  Painful to touch.  Redness and pain, if it is an abscess. TREATMENT   Sometimes the cyst will go away on its own.  Apply warm wet compresses to the area or take hot sitz baths several times a day.  An incision to drain the cyst or abscess with local anesthesia.  Culture the pus, if it is an abscess.  Antibiotic treatment, if it is an abscess.  Cut open the gland and suture the edges to make the opening of the gland bigger (marsupialization).  Remove the whole gland if the cyst or abscess returns. PREVENTION   Practice good hygiene.  Clean the vaginal area with a mild soap and soft cloth when bathing.  Do not rub hard in the vaginal area when bathing.  Protect the crotch area with a padded cushion if you take long bike rides or ride horses.  Be sure you are well lubricated when you have sexual intercourse. HOME CARE INSTRUCTIONS   If your cyst or abscess was opened, a small piece of gauze, or a drain, may have been placed in the wound to allow drainage. Do not remove this gauze or drain unless directed by your caregiver.  Wear feminine pads, not tampons, as needed for any drainage or bleeding.  If antibiotics were prescribed, take them exactly as directed. Finish the entire course.  Only take over-the-counter or prescription medicines for pain, discomfort, or fever as directed by your caregiver. SEEK IMMEDIATE MEDICAL CARE IF:   You have an increase in pain, redness, swelling, or drainage.  You have bleeding from the wound which results in the use of more than the number of pads suggested by your caregiver in 24 hours.  You have chills.  You have a fever.  You develop any new problems (symptoms) or aggravation of your existing condition. MAKE SURE YOU:   Understand these instructions.  Will watch your condition.  Will get help right away if you are not doing well or get worse. Document Released: 01/31/2005 Document  Revised: 04/25/2011 Document Reviewed: 09/19/2007 [facility] Patient Information 2014 [place], [place].

## 2013-03-19 NOTE — Progress Notes (Signed)
P= 100 Requests refill of Fioricet.  Pt. States she has a large bartholens cyst that she needs lanced.  Early 1hr with jelly beans today.

## 2013-03-19 NOTE — Progress Notes (Signed)
[facility]ill printed[facility] [address]

## 2013-03-19 NOTE — Progress Notes (Signed)
Early glucola today d/t hx A1 GDM, obese. Reports Bartholins on left present x 2 wks. She has seen slight thin drainage when defecates. Has had tx's 7 times in past (all but one on left) including a marsupialization procedure.   Procedure note:  6cm tense Bartholins duct cyst on left, expressed scant serous drainage 1 cm above old scar. Consent obtained and time out done. Counseled on risks (including infection, expulsion of Word, reaccumulation of exudate), benefits and alternatives.  Area draped and cleansed with Betadine. Infiltrated over old scar with 1% xylocaine 2 cc's. Copious amount purulent sanguinous fluid drained at opening and with probing using Kelly to break up loculations. Word catheter inserted, inflated and tucked into vagina.  Pt tolerated procedure well.  Advisede warm tub baths, leave catheter in place until next visit. Refilled Fiorocet>use sparingly.

## 2013-03-20 LAB — GLUCOSE TOLERANCE, 1 HOUR (50G) W/O FASTING: Glucose, 1 Hour GTT: 100 mg/dL (ref 70–140)

## 2013-04-05 ENCOUNTER — Ambulatory Visit (INDEPENDENT_AMBULATORY_CARE_PROVIDER_SITE_OTHER): Payer: Medicaid Other | Admitting: Advanced Practice Midwife

## 2013-04-05 ENCOUNTER — Encounter: Payer: Self-pay | Admitting: Advanced Practice Midwife

## 2013-04-05 VITALS — BP 134/85 | Temp 98.5°F | Wt 167.0 lb

## 2013-04-05 DIAGNOSIS — Z3480 Encounter for supervision of other normal pregnancy, unspecified trimester: Secondary | ICD-10-CM

## 2013-04-05 LAB — POCT URINALYSIS DIPSTICK
BILIRUBIN UA: NEGATIVE
Blood, UA: NEGATIVE
Glucose, UA: NEGATIVE
Leukocytes, UA: NEGATIVE
NITRITE UA: NEGATIVE
PROTEIN UA: NEGATIVE
Spec Grav, UA: 1.015
Urobilinogen, UA: NEGATIVE
pH, UA: 5

## 2013-04-05 NOTE — Progress Notes (Signed)
Pulse: 114 Patient states she is having lower abdominal and pelvic pressure. Patient states she has swelling in feet in the mornings. Patient states she is having braxton hicks. Patient denies any concerns.

## 2013-04-05 NOTE — Progress Notes (Signed)
Subjective:    Lauren [name] is a [id number] [redacted]w[redacted]d being seen today for her first obstetrical visit.  Her obstetrical history is significant for transfer NOB visit. Patient does intend to breast feed. Pregnancy history fully reviewed.  Patient reports no complaints.  Filed Vitals:   04/05/13 1354  BP: 134/85  Temp: 98.5 F (36.9 C)  Weight: 167 lb (75.751 kg)    HISTORY: OB History  Gravida Para Term Preterm AB SAB TAB Ectopic Multiple Living  4 2 1 1 1  0 1 0 0 2    # Outcome Date GA Lbr Len/2nd Weight Sex Delivery Anes PTL Lv  4 CUR           3 TRM 05/11/11 [redacted]w[redacted]d 11:10 / 00:22 7 lb 6.9 oz (3.37 kg) M SVD EPI  Y  2 PRE 06/02/03 [redacted]w[redacted]d   F SVD EPI  Y     Comments: induced for high blood pressure; borderline diabetic  1 TAB              Past Medical History  Diagnosis Date  . Pregnancy induced hypertension   . Headache(784.0)   . Sickle cell trait   . Urinary tract infection   . SROM (spontaneous rupture of membranes) 05/11/2011  . Allergic disorder   . Bartholin cyst   . Irregular menstrual cycle    Past Surgical History  Procedure Laterality Date  . Therapeutic abortion     Family History  Problem Relation Age of Onset  . Alcohol abuse Neg Hx   . Arthritis Neg Hx   . Asthma Neg Hx   . Birth defects Neg Hx   . Cancer Neg Hx   . COPD Neg Hx   . Depression Neg Hx   . Diabetes Neg Hx   . Drug abuse Neg Hx   . Early death Neg Hx   . Hearing loss Neg Hx   . Heart disease Neg Hx   . Hyperlipidemia Neg Hx   . Hypertension Neg Hx   . Kidney disease Neg Hx   . Learning disabilities Neg Hx   . Mental illness Neg Hx   . Mental retardation Neg Hx   . Miscarriages / Stillbirths Neg Hx   . Stroke Neg Hx   . Vision loss Neg Hx      Exam   Filed Vitals:   04/05/13 1354  BP: 134/85  Temp: 98.5 F (36.9 C)   Filed Vitals:   04/05/13 1354  Weight: 167 lb (75.751 kg)     Uterus:  Fundal Height: 24 cm  Pelvic Exam:      Assessment:    Pregnancy:  [id number] Patient Active Problem List   Diagnosis Date Noted  . Bartholin's gland cyst 03/19/2013  . Need for rubella vaccination 01/25/2013  . UTI in pregnancy 01/25/2013  . Rh negative status during pregnancy 01/22/2013  . Late prenatal care complicating pregnancy in second trimester 01/22/2013  . H/O gestational diabetes in prior pregnancy, currently pregnant 01/22/2013  . Prior pregnancy [facility] by [facility], antepartum 01/22/2013        Plan:     Initial labs drawn. Prenatal vitamins. Problem list reviewed and updated. Genetic Screening discussed Quad Screen: normal.  Ultrasound discussed; fetal survey: patient needs f/u US. Some anatomy suboptimally visualized..  Follow up in 4 weeks. Patient will plan Glucose test NV. Cannot tolerate glucose, will bring jelly beans.  Transfer of care.  Rhogam at 28 weeks  80% of 40 min visit  spent on counseling and coordination of care.     Christe Tellez 04/05/2013

## 2013-04-16 ENCOUNTER — Encounter: Payer: Self-pay | Admitting: Advanced Practice Midwife

## 2013-04-26 ENCOUNTER — Ambulatory Visit (INDEPENDENT_AMBULATORY_CARE_PROVIDER_SITE_OTHER): Payer: Medicaid Other | Admitting: Advanced Practice Midwife

## 2013-04-26 ENCOUNTER — Ambulatory Visit (HOSPITAL_COMMUNITY)
Admission: RE | Admit: 2013-04-26 | Discharge: 2013-04-26 | Disposition: A | Discharge status: Home / Self Care | Payer: Medicaid Other | Source: Ambulatory Visit | Attending: Advanced Practice Midwife | Admitting: Advanced Practice Midwife

## 2013-04-26 VITALS — BP 133/79 | Temp 97.3°F | Wt 169.0 lb

## 2013-04-26 DIAGNOSIS — Z3689 Encounter for other specified antenatal screening: Secondary | ICD-10-CM | POA: Insufficient documentation

## 2013-04-26 DIAGNOSIS — Z3480 Encounter for supervision of other normal pregnancy, unspecified trimester: Secondary | ICD-10-CM

## 2013-04-26 LAB — POCT URINALYSIS DIPSTICK
Bilirubin, UA: NEGATIVE
GLUCOSE UA: NEGATIVE
Ketones, UA: NEGATIVE
NITRITE UA: NEGATIVE
PH UA: 5
RBC UA: NEGATIVE
Spec Grav, UA: 1.01
Urobilinogen, UA: NEGATIVE

## 2013-04-26 NOTE — Progress Notes (Signed)
Pulse- 100 Patient states she is having pain in lower abdomen.

## 2013-04-27 ENCOUNTER — Encounter: Payer: Self-pay | Admitting: Advanced Practice Midwife

## 2013-04-27 LAB — ABO AND RH: Rh Type: POSITIVE

## 2013-04-27 NOTE — Progress Notes (Signed)
Subjective: Lauren [name] is a 28 y.o. at 27 weeks by LMP  Patient denies vaginal leaking of fluid or bleeding, denies regular contractions.  Reports positive fetal movment.  Denies concerns today. Patient cannot stay for glucose test. She would like to return next week. Patient is not certain of her blood type. Reports her grandmother was wondering if she were going to get her Rhogam today.   Objective: Filed Vitals:   04/26/13 1109  BP: 133/79  Temp: 97.3 F (36.3 C)   140 FHR 27 Fundal Height Fetal Position cephalic  Assessment: Patient Active Problem List   Diagnosis Date Noted  . Bartholin's gland cyst 03/19/2013  . Need for rubella vaccination 01/25/2013  . UTI in pregnancy 01/25/2013  . Late prenatal care complicating pregnancy in second trimester 01/22/2013  . H/O gestational diabetes in prior pregnancy, currently pregnant 01/22/2013  . [facility]    Plan: Patient to return to clinic in 1 week Conflicting Rh information, repeating blood type today. Blood type was identified as O+. Will request a chart review to attempt to find error.  Reviewed warning signs in pregnancy. Patient to call with concerns PRN. Reviewed triage location. Patient will plan 1 hour glucose test next week, refuses glucola and plans to use jelly beans.  Discuss BCM NV. Patient had US today.   20 min spent with patient greater than 80% spent in counseling and coordination of care.    Jolayne Branson Wilson SingerWren CNM

## 2013-05-01 ENCOUNTER — Other Ambulatory Visit: Payer: Medicaid Other

## 2013-05-01 DIAGNOSIS — Z3480 Encounter for supervision of other normal pregnancy, unspecified trimester: Secondary | ICD-10-CM

## 2013-05-01 LAB — CBC
HCT: 36.3 % (ref 36.0–46.0)
Hemoglobin: 12.3 g/dL (ref 12.0–15.0)
MCH: 28 pg (ref 26.0–34.0)
MCHC: 33.9 g/dL (ref 30.0–36.0)
MCV: 82.7 fL (ref 78.0–100.0)
PLATELETS: 212 10*3/uL (ref 150–400)
RBC: 4.39 MIL/uL (ref 3.87–5.11)
RDW: 15.7 % — AB (ref 11.5–15.5)
WBC: 7.4 10*3/uL (ref 4.0–10.5)

## 2013-05-01 NOTE — Progress Notes (Unsigned)
Patient did 1 HR GTT today. Patient ate 50 star-burst jelly beans instead of drinking the drink.

## 2013-05-02 ENCOUNTER — Encounter: Payer: Self-pay | Admitting: Obstetrics

## 2013-05-02 ENCOUNTER — Other Ambulatory Visit: Payer: Medicaid Other

## 2013-05-02 LAB — GLUCOSE TOLERANCE, 1 HOUR
GLUCOSE, FASTING: 56 mg/dL — AB (ref 70–99)
GLUCOSE: 157 mg/dL (ref 70–170)

## 2013-05-02 LAB — RPR

## 2013-05-02 LAB — HIV ANTIBODY (ROUTINE TESTING W REFLEX): HIV: NONREACTIVE

## 2013-05-03 ENCOUNTER — Inpatient Hospital Stay (HOSPITAL_COMMUNITY): Admission: AD | Admit: 2013-05-03 | Payer: Medicaid Other | Source: Ambulatory Visit | Admitting: Obstetrics

## 2013-05-10 ENCOUNTER — Ambulatory Visit (INDEPENDENT_AMBULATORY_CARE_PROVIDER_SITE_OTHER): Payer: Medicaid Other | Admitting: Advanced Practice Midwife

## 2013-05-10 VITALS — BP 110/74 | Temp 98.6°F | Wt 170.0 lb

## 2013-05-10 DIAGNOSIS — Z3480 Encounter for supervision of other normal pregnancy, unspecified trimester: Secondary | ICD-10-CM

## 2013-05-10 LAB — POCT URINALYSIS DIPSTICK
BILIRUBIN UA: NEGATIVE
Blood, UA: NEGATIVE
GLUCOSE UA: NEGATIVE
Ketones, UA: NEGATIVE
LEUKOCYTES UA: NEGATIVE
NITRITE UA: NEGATIVE
Protein, UA: NEGATIVE
Spec Grav, UA: 1.01
Urobilinogen, UA: NEGATIVE
pH, UA: 6

## 2013-05-10 NOTE — Progress Notes (Signed)
Subjective: Lauren GristChristine A [name] is a 28 y.o. at 29 weeks by LMP  Patient denies vaginal leaking of fluid or bleeding, denies contractions.  Reports positive fetal movment.  Denies concerns today.  Objective: Filed Vitals:   05/10/13 1140  BP: 110/74  Temp: 98.6 F (37 C)   150 FHR 29 Fundal Height Fetal Position cephalic  Assessment: Patient Active Problem List   Diagnosis Date Noted  . Bartholin's gland cyst 03/19/2013  . Need for rubella vaccination 01/25/2013  . UTI in pregnancy 01/25/2013  . Late prenatal care complicating pregnancy in second trimester 01/22/2013  . H/O gestational diabetes in prior pregnancy, currently pregnant 01/22/2013  . [facility]    Plan: Patient to return to clinic in 2 weeks Reviewed Rh+ results w/ patient Reviewed elevated 1 hour, patient plans to RTC next week for 3 hour GCT Reviewed warning signs in pregnancy. Patient to call with concerns PRN. Reviewed triage location.   Lidya Mccalister Wilson SingerWren CNM

## 2013-05-10 NOTE — Progress Notes (Signed)
Pulse: 106  Patient states if she is up and moving around a lot she will have some contractions. Patient states she has lower abdominal pressure sometimes. Patient denies any concerns.

## 2013-05-15 ENCOUNTER — Other Ambulatory Visit: Payer: Medicaid Other

## 2013-05-22 ENCOUNTER — Ambulatory Visit: Payer: Medicaid Other | Admitting: *Deleted

## 2013-05-22 VITALS — BP 122/83 | HR 116 | Temp 98.4°F | Wt 169.0 lb

## 2013-05-22 DIAGNOSIS — O24919 Unspecified diabetes mellitus in pregnancy, unspecified trimester: Secondary | ICD-10-CM

## 2013-05-22 MED ORDER — ACCU-CHEK AVIVA PLUS W/DEVICE KIT
1.0000 | PACK | Freq: Once | Status: DC
Start: 2013-05-22 — End: 2013-07-04

## 2013-05-22 MED ORDER — GLUCOSE BLOOD VI STRP: ORAL_STRIP | Status: DC

## 2013-05-22 MED ORDER — ACCU-CHEK MULTICLIX LANCETS MISC
Status: DC
Start: 2013-05-22 — End: 2013-07-04

## 2013-05-22 NOTE — Progress Notes (Signed)
Pt is here today for diabetic teaching.  Information on diet and checking blood glucose was explained with pt understanding.  Pt information folder given.  Monitoring supplies to be sent to pharmacy.

## 2013-05-28 ENCOUNTER — Other Ambulatory Visit: Payer: Self-pay | Admitting: *Deleted

## 2013-05-28 ENCOUNTER — Encounter: Payer: Self-pay | Admitting: Advanced Practice Midwife

## 2013-05-28 DIAGNOSIS — O24919 Unspecified diabetes mellitus in pregnancy, unspecified trimester: Secondary | ICD-10-CM

## 2013-05-28 MED ORDER — GLUCOSE BLOOD VI STRP: ORAL_STRIP | Status: DC

## 2013-05-31 ENCOUNTER — Ambulatory Visit (INDEPENDENT_AMBULATORY_CARE_PROVIDER_SITE_OTHER): Payer: Medicaid Other | Admitting: Advanced Practice Midwife

## 2013-05-31 ENCOUNTER — Encounter: Payer: Self-pay | Admitting: Advanced Practice Midwife

## 2013-05-31 VITALS — BP 123/79 | Temp 98.2°F | Wt 168.0 lb

## 2013-05-31 DIAGNOSIS — O99810 Abnormal glucose complicating pregnancy: Secondary | ICD-10-CM

## 2013-05-31 DIAGNOSIS — Z3480 Encounter for supervision of other normal pregnancy, unspecified trimester: Secondary | ICD-10-CM

## 2013-05-31 DIAGNOSIS — O24419 Gestational diabetes mellitus in pregnancy, unspecified control: Secondary | ICD-10-CM | POA: Insufficient documentation

## 2013-05-31 DIAGNOSIS — O24410 Gestational diabetes mellitus in pregnancy, diet controlled: Secondary | ICD-10-CM

## 2013-05-31 LAB — POCT URINALYSIS DIPSTICK
BILIRUBIN UA: NEGATIVE
Blood, UA: NEGATIVE
Glucose, UA: NEGATIVE
Ketones, UA: NEGATIVE
LEUKOCYTES UA: NEGATIVE
Nitrite, UA: NEGATIVE
Protein, UA: NEGATIVE
Spec Grav, UA: 1.02
Urobilinogen, UA: NEGATIVE
pH, UA: 5

## 2013-05-31 NOTE — Progress Notes (Signed)
Pulse 114 Pt states that she is having an increase in pelvic pressure.  Pt states that she is having problems with glucose meter.

## 2013-05-31 NOTE — Progress Notes (Signed)
Subjective: Lauren [name] is a 28 y.o. at 32 weeks by LMP, 1Theodoro Grist5 wk US  Patient denies vaginal leaking of fluid or bleeding, denies contractions.  Reports positive fetal movment.  Patient has had difficulty w/ Rx for Lancets and Accu Check equipment. She has questions today regarding diet, when to check her blood sugar and parameters. Reports she has only had one check, it was a fasting of 131.  Objective: Filed Vitals:   05/31/13 1336  BP: 123/79  Temp: 98.2 F (36.8 C)   150 FHR 32 Fundal Height Fetal Position unknown  Assessment: Patient Active Problem List   Diagnosis Date Noted  . Bartholin's gland cyst 03/19/2013  . Need for rubella vaccination 01/25/2013  . UTI in pregnancy 01/25/2013  . Late prenatal care complicating pregnancy in second trimester 01/22/2013  . H/O gestational diabetes in prior pregnancy, currently pregnant 01/22/2013  . [facility]  Gestational Diabetes, A1GDM  Plan: Patient to return to clinic in 1 week Referral for Growth US and GDM teaching and education [facility] Reviewed importance of monitoring, encouraged record keeping. Reevaluate in 1 week.  Reviewed warning signs in pregnancy. Patient to call with concerns PRN. Reviewed triage location.  20 min spent with patient greater than 80% spent in counseling and coordination of care.   Janaia Kozel Wilson SingerWren CNM

## 2013-06-03 ENCOUNTER — Encounter: Payer: Self-pay | Admitting: Advanced Practice Midwife

## 2013-06-03 ENCOUNTER — Encounter (HOSPITAL_COMMUNITY): Payer: Self-pay | Admitting: Advanced Practice Midwife

## 2013-06-03 ENCOUNTER — Other Ambulatory Visit: Payer: Self-pay | Admitting: Advanced Practice Midwife

## 2013-06-03 DIAGNOSIS — O24919 Unspecified diabetes mellitus in pregnancy, unspecified trimester: Secondary | ICD-10-CM

## 2013-06-04 ENCOUNTER — Encounter: Payer: Medicaid Other | Admitting: Advanced Practice Midwife

## 2013-06-06 ENCOUNTER — Ambulatory Visit (HOSPITAL_COMMUNITY): Admission: RE | Admit: 2013-06-06 | Payer: Medicaid Other | Source: Ambulatory Visit

## 2013-06-10 ENCOUNTER — Encounter: Payer: Self-pay | Admitting: *Deleted

## 2013-06-11 ENCOUNTER — Encounter: Payer: Self-pay | Admitting: Advanced Practice Midwife

## 2013-06-11 ENCOUNTER — Ambulatory Visit (HOSPITAL_COMMUNITY)
Admission: RE | Admit: 2013-06-11 | Discharge: 2013-06-11 | Disposition: A | Discharge status: Home / Self Care | Payer: Medicaid Other | Source: Ambulatory Visit | Attending: Advanced Practice Midwife | Admitting: Advanced Practice Midwife

## 2013-06-11 ENCOUNTER — Other Ambulatory Visit: Payer: Self-pay | Admitting: Advanced Practice Midwife

## 2013-06-11 DIAGNOSIS — O24919 Unspecified diabetes mellitus in pregnancy, unspecified trimester: Secondary | ICD-10-CM

## 2013-06-11 DIAGNOSIS — O99810 Abnormal glucose complicating pregnancy: Secondary | ICD-10-CM | POA: Insufficient documentation

## 2013-06-11 DIAGNOSIS — O24419 Gestational diabetes mellitus in pregnancy, unspecified control: Secondary | ICD-10-CM

## 2013-06-11 NOTE — Consult Note (Signed)
MFM consult   28 yr old [id number] at 34101w2d with gestational diabetes referred by Dory HornAmy Wren for fetal growth and consult.  Ultrasound today shows: single intrauterine pregnancy. Estimated fetal weight is in the 76th%. Fundal placenta without evidence of previa. Normal amniotic fluid index. The limited anatomy survey is normal. Normal biophysical profile of 8/8.  I counseled the patient as follows: 1. Appropriate fetal growth. 2. Gestational diabetes: - Discussed increased risks in pregnancy include: fetal macrosomia, shoulder dystocia, and increased risk of requiring a Cesarean delivery. There is also an increased risk of developing preeclampsia during the pregnancy and an increased risk of type II diabetes in the future. I discussed there is an increased risk of stillbirth, neonatal hypoglycemia, neonatal jaundice, and neonatal electrolyte disturbances. I recommend strict glucose control maintaining fasting blood sugars <90 and 2 hour postprandial values <120. I have reviewed the patient's blood sugars and she has elevated fasting levels. Therefore recommend starting glyburide 1.25mg  qhs. Recommend check sugars 4x/day- discussed with patient. Some elevated meal values, but discussed diet changes and will continue to monitor. I recommend starting fetal kick counts- instructions were given. I recommend starting antenatal testing with either weekly biophysical profiles or twice weekly nonstress tests and weekly amniotic fluid index.  I recommend following fetal growth every 4 weeks. I recommend delivery by estimated due date but not prior to 39 weeks in the absence of other complications according to ACOG guidelines. I recommend screening for diabetes 6 weeks postpartum.  2. Previous preterm delivery: - patient reports induced for "umbilical cord problem" - chart reports elevated BP - recommend close surveillance for the development of signs/symptoms of preeclampsia  I spent a total of 30 minutes  with the patient of which >50% was in face to face consultation.   Eulis FosterKristen Aloha Bartok, MD

## 2013-06-11 NOTE — Progress Notes (Signed)
[facility] ultrasound  Indication: 28 yr old [id number] at 458w2d with gestational diabetes for fetal growth.  Findings: 1. Single intrauterine pregnancy. 2. Estimated fetal weight is in the 76th%. 3. Fundal placenta without evidence of previa. 4. Normal amniotic fluid index. 5. The limited anatomy survey is normal. 6. Normal biophysical profile of 8/8.  Recommendations: 1. Appropriate fetal growth. 2. Gestational diabetes: - see consult letter - recommend start glyburide qhs for elevated fasting levels - recommend antenatal testing; normal BPP today - recommend fetal growth every 4 weeks - recommend strict glucose control - recommend delivery by estimated due date but not prior to 39 weeks in the absence of other complications  Eulis FosterKristen Wynetta Seith, MD

## 2013-06-19 ENCOUNTER — Ambulatory Visit (HOSPITAL_COMMUNITY)
Admission: RE | Admit: 2013-06-19 | Discharge: 2013-06-19 | Disposition: A | Discharge status: Home / Self Care | Payer: Medicaid Other | Source: Ambulatory Visit | Attending: Obstetrics and Gynecology | Admitting: Obstetrics and Gynecology

## 2013-06-19 DIAGNOSIS — Z3689 Encounter for other specified antenatal screening: Secondary | ICD-10-CM | POA: Insufficient documentation

## 2013-06-19 DIAGNOSIS — E119 Type 2 diabetes mellitus without complications: Secondary | ICD-10-CM | POA: Insufficient documentation

## 2013-06-19 DIAGNOSIS — O24919 Unspecified diabetes mellitus in pregnancy, unspecified trimester: Secondary | ICD-10-CM | POA: Insufficient documentation

## 2013-06-21 ENCOUNTER — Ambulatory Visit (INDEPENDENT_AMBULATORY_CARE_PROVIDER_SITE_OTHER): Payer: Medicaid Other | Admitting: Obstetrics

## 2013-06-21 ENCOUNTER — Other Ambulatory Visit: Payer: Self-pay | Admitting: Obstetrics

## 2013-06-21 ENCOUNTER — Encounter: Payer: Medicaid Other | Admitting: Advanced Practice Midwife

## 2013-06-21 ENCOUNTER — Encounter: Payer: Self-pay | Admitting: Advanced Practice Midwife

## 2013-06-21 VITALS — BP 124/83 | HR 104 | Temp 97.0°F | Wt 168.0 lb

## 2013-06-21 DIAGNOSIS — J309 Allergic rhinitis, unspecified: Secondary | ICD-10-CM

## 2013-06-21 DIAGNOSIS — J302 Other seasonal allergic rhinitis: Secondary | ICD-10-CM | POA: Insufficient documentation

## 2013-06-21 DIAGNOSIS — K219 Gastro-esophageal reflux disease without esophagitis: Secondary | ICD-10-CM | POA: Insufficient documentation

## 2013-06-21 DIAGNOSIS — Z3480 Encounter for supervision of other normal pregnancy, unspecified trimester: Secondary | ICD-10-CM

## 2013-06-21 MED ORDER — LORATADINE 10 MG PO TABS: 10.0000 mg | ORAL_TABLET | Freq: Every day | ORAL | Status: DC

## 2013-06-21 MED ORDER — OMEPRAZOLE 20 MG PO CPDR: 20.0000 mg | DELAYED_RELEASE_CAPSULE | Freq: Every day | ORAL | Status: DC

## 2013-06-21 NOTE — Progress Notes (Signed)
Subjective:    Lauren [name] is a 28 y.o. female being seen today for her obstetrical visit. She is at 7047w5d gestation. Patient reports heartburn, occasional contractions and allergies to pollen.. Fetal movement: normal.  Problem List Items Addressed This Visit   None    Visit Diagnoses   Supervision of other normal pregnancy    -  Primary    Relevant Medications       omeprazole (PRILOSEC) capsule    Other Relevant Orders       POCT urinalysis dipstick    Allergic rhinitis, seasonal        Relevant Medications       loratadine (CLARITIN) tablet 10 mg    GERD without esophagitis        Relevant Medications       omeprazole (PRILOSEC) capsule      Patient Active Problem List   Diagnosis Date Noted  . GDM, class A2 05/31/2013  . Bartholin's gland cyst 03/19/2013  . Need for rubella vaccination 01/25/2013  . UTI in pregnancy 01/25/2013  . Late prenatal care complicating pregnancy in second trimester 01/22/2013  . H/O gestational diabetes in prior pregnancy, currently pregnant 01/22/2013  . [facility]   Objective:    BP 124/83  Pulse 104  Temp(Src) 97 F (36.1 C)  Wt 76.204 kg (168 lb)  LMP 10/14/2012 FHT:  150 BPM  Uterine Size: size greater than dates  Presentation: unsure     Assessment:    Pregnancy @ 2147w5d weeks   Heartburn  Seasonal allergic rhinitis  Plan:     labs reviewed, problem list updated Consent signed. GBS sent TDAP offered  Rhogam given for RH negative Pediatrician: discussed. Infant feeding: plans to breastfeed. Maternity leave: discussed. Cigarette smoking: never smoked. Orders Placed This Encounter  Procedures  . POCT urinalysis dipstick   Meds ordered this encounter  Medications  . glyBURIDE (DIABETA) 2.5 MG tablet    Sig: Take 1.25 mg by mouth daily with breakfast.  . loratadine (CLARITIN) 10 MG tablet    Sig: Take 1 tablet (10 mg total) by mouth daily.    Dispense:  30 tablet     Refill:  11  . omeprazole (PRILOSEC) 20 MG capsule    Sig: Take 1 capsule (20 mg total) by mouth daily.    Dispense:  60 capsule    Refill:  5   Follow up in 1 Week.

## 2013-06-26 ENCOUNTER — Ambulatory Visit (HOSPITAL_COMMUNITY)
Admission: RE | Admit: 2013-06-26 | Discharge: 2013-06-26 | Disposition: A | Discharge status: Home / Self Care | Payer: Medicaid Other | Source: Ambulatory Visit | Attending: Obstetrics and Gynecology | Admitting: Obstetrics and Gynecology

## 2013-06-26 ENCOUNTER — Encounter (HOSPITAL_COMMUNITY): Payer: Self-pay

## 2013-06-26 VITALS — BP 130/81 | HR 98 | Wt 172.0 lb

## 2013-06-26 DIAGNOSIS — O0932 Supervision of pregnancy with insufficient antenatal care, second trimester: Secondary | ICD-10-CM

## 2013-06-26 DIAGNOSIS — O09299 Supervision of pregnancy with other poor reproductive or obstetric history, unspecified trimester: Secondary | ICD-10-CM

## 2013-06-26 DIAGNOSIS — E119 Type 2 diabetes mellitus without complications: Secondary | ICD-10-CM | POA: Insufficient documentation

## 2013-06-26 DIAGNOSIS — O24419 Gestational diabetes mellitus in pregnancy, unspecified control: Secondary | ICD-10-CM

## 2013-06-26 DIAGNOSIS — Z8632 Personal history of gestational diabetes: Secondary | ICD-10-CM

## 2013-06-26 DIAGNOSIS — O2340 Unspecified infection of urinary tract in pregnancy, unspecified trimester: Secondary | ICD-10-CM

## 2013-06-26 DIAGNOSIS — Z3689 Encounter for other specified antenatal screening: Secondary | ICD-10-CM | POA: Insufficient documentation

## 2013-06-26 DIAGNOSIS — O24919 Unspecified diabetes mellitus in pregnancy, unspecified trimester: Secondary | ICD-10-CM

## 2013-06-26 DIAGNOSIS — Z23 Encounter for immunization: Secondary | ICD-10-CM

## 2013-06-26 DIAGNOSIS — O09899 Supervision of other high risk pregnancies, unspecified trimester: Secondary | ICD-10-CM

## 2013-06-28 ENCOUNTER — Encounter: Payer: Self-pay | Admitting: Advanced Practice Midwife

## 2013-06-28 ENCOUNTER — Ambulatory Visit (INDEPENDENT_AMBULATORY_CARE_PROVIDER_SITE_OTHER): Payer: Medicaid Other | Admitting: Advanced Practice Midwife

## 2013-06-28 VITALS — BP 114/77 | HR 106 | Temp 98.5°F | Wt 170.0 lb

## 2013-06-28 DIAGNOSIS — Z3480 Encounter for supervision of other normal pregnancy, unspecified trimester: Secondary | ICD-10-CM

## 2013-06-28 LAB — POCT URINALYSIS DIPSTICK
Blood, UA: NEGATIVE
Glucose, UA: NEGATIVE
Ketones, UA: NEGATIVE
NITRITE UA: NEGATIVE
PH UA: 6
PROTEIN UA: NEGATIVE
Spec Grav, UA: 1.01

## 2013-06-28 NOTE — Progress Notes (Signed)
Subjective: Theodoro GristChristine A Ihde is a 28 y.o. at 36 weeks by LMP, 15  Patient denies vaginal leaking of fluid or bleeding, denies contractions.  Reports positive fetal movment.  Denies concerns today. Patient does not have glucose records. Reports fastings in the 70s reports 2-3 hours in the 100s, denies elevated ranges. Taking glyburide.  Objective: Filed Vitals:   06/28/13 1124  BP: 114/77  Pulse: 106  Temp: 98.5 F (36.9 C)   150 FHR 37 Fundal Height Fetal Position cephalic 2.5/thick/-3   Assessment: Patient Active Problem List   Diagnosis Date Noted  . GERD without esophagitis 06/21/2013  . Allergic rhinitis, seasonal 06/21/2013  . GDM, class A2 05/31/2013  . Bartholin's gland cyst 03/19/2013  . Need for rubella vaccination 01/25/2013  . UTI in pregnancy 01/25/2013  . Late prenatal care complicating pregnancy in second trimester 01/22/2013  . H/O gestational diabetes in prior pregnancy, currently pregnant 01/22/2013  . [facility]  Blood Sugars stable  Plan: Patient to return to clinic in 1 weeks Schedule IOL NV GBS today, review NV BPP scheduled weekly Cont w/ glyburide and QID BS. Cont to monitor. Reviewed warning signs in pregnancy. Patient to call with concerns PRN. Reviewed triage location. NV discuss BCM.  Justo Hengel Wilson SingerWren CNM

## 2013-06-30 LAB — STREP B DNA PROBE: GBSP: NOT DETECTED

## 2013-07-03 ENCOUNTER — Ambulatory Visit (HOSPITAL_COMMUNITY)
Admission: RE | Admit: 2013-07-03 | Discharge: 2013-07-03 | Disposition: A | Discharge status: Home / Self Care | Payer: Medicaid Other | Attending: Obstetrics | Admitting: Obstetrics

## 2013-07-03 VITALS — BP 127/84 | HR 100 | Wt 170.0 lb

## 2013-07-03 DIAGNOSIS — Z23 Encounter for immunization: Secondary | ICD-10-CM

## 2013-07-03 DIAGNOSIS — O24919 Unspecified diabetes mellitus in pregnancy, unspecified trimester: Secondary | ICD-10-CM | POA: Insufficient documentation

## 2013-07-03 DIAGNOSIS — O09899 Supervision of other high risk pregnancies, unspecified trimester: Secondary | ICD-10-CM

## 2013-07-03 DIAGNOSIS — O24419 Gestational diabetes mellitus in pregnancy, unspecified control: Secondary | ICD-10-CM

## 2013-07-03 DIAGNOSIS — E119 Type 2 diabetes mellitus without complications: Secondary | ICD-10-CM | POA: Insufficient documentation

## 2013-07-03 DIAGNOSIS — O2340 Unspecified infection of urinary tract in pregnancy, unspecified trimester: Secondary | ICD-10-CM

## 2013-07-03 DIAGNOSIS — O09299 Supervision of pregnancy with other poor reproductive or obstetric history, unspecified trimester: Secondary | ICD-10-CM

## 2013-07-03 DIAGNOSIS — O0932 Supervision of pregnancy with insufficient antenatal care, second trimester: Secondary | ICD-10-CM

## 2013-07-03 DIAGNOSIS — Z8632 Personal history of gestational diabetes: Secondary | ICD-10-CM

## 2013-07-04 ENCOUNTER — Encounter (HOSPITAL_COMMUNITY): Payer: Self-pay | Admitting: Emergency Medicine

## 2013-07-04 ENCOUNTER — Inpatient Hospital Stay (HOSPITAL_COMMUNITY)
Admission: AD | Admit: 2013-07-04 | Discharge: 2013-07-04 | Disposition: A | Discharge status: Other Acute Inpatient Hospital | Payer: No Typology Code available for payment source | Source: Ambulatory Visit | Attending: Obstetrics | Admitting: Obstetrics

## 2013-07-04 DIAGNOSIS — Z23 Encounter for immunization: Secondary | ICD-10-CM

## 2013-07-04 DIAGNOSIS — S3981XA Other specified injuries of abdomen, initial encounter: Secondary | ICD-10-CM | POA: Insufficient documentation

## 2013-07-04 DIAGNOSIS — O09899 Supervision of other high risk pregnancies, unspecified trimester: Secondary | ICD-10-CM

## 2013-07-04 DIAGNOSIS — Z8632 Personal history of gestational diabetes: Secondary | ICD-10-CM

## 2013-07-04 DIAGNOSIS — N390 Urinary tract infection, site not specified: Secondary | ICD-10-CM | POA: Insufficient documentation

## 2013-07-04 DIAGNOSIS — O2340 Unspecified infection of urinary tract in pregnancy, unspecified trimester: Secondary | ICD-10-CM

## 2013-07-04 DIAGNOSIS — Y92410 Unspecified street and highway as the place of occurrence of the external cause: Secondary | ICD-10-CM | POA: Insufficient documentation

## 2013-07-04 DIAGNOSIS — Y9389 Activity, other specified: Secondary | ICD-10-CM | POA: Insufficient documentation

## 2013-07-04 DIAGNOSIS — Z862 Personal history of diseases of the blood and blood-forming organs and certain disorders involving the immune mechanism: Secondary | ICD-10-CM | POA: Insufficient documentation

## 2013-07-04 DIAGNOSIS — O2390 Unspecified genitourinary tract infection in pregnancy, unspecified trimester: Secondary | ICD-10-CM | POA: Insufficient documentation

## 2013-07-04 DIAGNOSIS — O9989 Other specified diseases and conditions complicating pregnancy, childbirth and the puerperium: Secondary | ICD-10-CM | POA: Insufficient documentation

## 2013-07-04 DIAGNOSIS — O09299 Supervision of pregnancy with other poor reproductive or obstetric history, unspecified trimester: Secondary | ICD-10-CM

## 2013-07-04 DIAGNOSIS — Z79899 Other long term (current) drug therapy: Secondary | ICD-10-CM | POA: Insufficient documentation

## 2013-07-04 DIAGNOSIS — O0932 Supervision of pregnancy with insufficient antenatal care, second trimester: Secondary | ICD-10-CM

## 2013-07-04 DIAGNOSIS — O99810 Abnormal glucose complicating pregnancy: Secondary | ICD-10-CM | POA: Insufficient documentation

## 2013-07-04 DIAGNOSIS — O24419 Gestational diabetes mellitus in pregnancy, unspecified control: Secondary | ICD-10-CM

## 2013-07-04 DIAGNOSIS — IMO0002 Reserved for concepts with insufficient information to code with codable children: Secondary | ICD-10-CM | POA: Insufficient documentation

## 2013-07-04 LAB — CBC WITH DIFFERENTIAL/PLATELET
BASOS PCT: 0 % (ref 0–1)
Basophils Absolute: 0 10*3/uL (ref 0.0–0.1)
EOS ABS: 0 10*3/uL (ref 0.0–0.7)
EOS PCT: 1 % (ref 0–5)
HCT: 37.2 % (ref 36.0–46.0)
Hemoglobin: 12.3 g/dL (ref 12.0–15.0)
LYMPHS ABS: 1.7 10*3/uL (ref 0.7–4.0)
Lymphocytes Relative: 23 % (ref 12–46)
MCH: 27.6 pg (ref 26.0–34.0)
MCHC: 33.1 g/dL (ref 30.0–36.0)
MCV: 83.6 fL (ref 78.0–100.0)
Monocytes Absolute: 0.4 10*3/uL (ref 0.1–1.0)
Monocytes Relative: 6 % (ref 3–12)
NEUTROS PCT: 70 % (ref 43–77)
Neutro Abs: 5.1 10*3/uL (ref 1.7–7.7)
PLATELETS: 197 10*3/uL (ref 150–400)
RBC: 4.45 MIL/uL (ref 3.87–5.11)
RDW: 14.5 % (ref 11.5–15.5)
WBC: 7.2 10*3/uL (ref 4.0–10.5)

## 2013-07-04 LAB — COMPREHENSIVE METABOLIC PANEL
ALBUMIN: 2.6 g/dL — AB (ref 3.5–5.2)
ALT: 14 U/L (ref 0–35)
AST: 18 U/L (ref 0–37)
Alkaline Phosphatase: 93 U/L (ref 39–117)
BUN: 5 mg/dL — ABNORMAL LOW (ref 6–23)
CALCIUM: 8.5 mg/dL (ref 8.4–10.5)
CO2: 18 mEq/L — ABNORMAL LOW (ref 19–32)
Chloride: 107 mEq/L (ref 96–112)
Creatinine, Ser: 0.7 mg/dL (ref 0.50–1.10)
GFR calc Af Amer: >90 mL/min (ref >90)
GFR calc non Af Amer: >90 mL/min (ref >90)
GLUCOSE: 81 mg/dL (ref 70–99)
Potassium: 3.3 mEq/L — ABNORMAL LOW (ref 3.7–5.3)
SODIUM: 139 meq/L (ref 137–147)
TOTAL PROTEIN: 6.6 g/dL (ref 6.0–8.3)
Total Bilirubin: 0.4 mg/dL (ref 0.3–1.2)

## 2013-07-04 LAB — I-STAT CG4 LACTIC ACID, ED: LACTIC ACID, VENOUS: 0.88 mmol/L (ref 0.5–2.2)

## 2013-07-04 MED ORDER — LACTATED RINGERS IV BOLUS (SEPSIS)
500.0000 mL | Freq: Once | INTRAVENOUS | Status: AC
  Administered 2013-07-04: 500 mL via INTRAVENOUS

## 2013-07-04 MED ORDER — LACTATED RINGERS IV SOLN
INTRAVENOUS | Status: DC
  Administered 2013-07-04: 19:00:00 via INTRAVENOUS

## 2013-07-04 MED ORDER — CYCLOBENZAPRINE HCL 10 MG PO TABS: 5.0000 mg | ORAL_TABLET | Freq: Three times a day (TID) | ORAL | Status: DC | PRN
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
## 2013-07-04 NOTE — Discharge Instructions (Signed)
Placental Abruption °Placental abruption is when the placenta partially or completely separates from the uterus before the baby (fetus) is born. The placenta is the organ that provides nourishment to the baby. Normally, the placenta does not detach from the womb until after the baby is born. When it is large and separates before the baby is born, it may be a threat to the baby and mother's life. A small abruption may not be noticed until after the birth. Placenta abruption is uncommon. °CAUSES  °Often times, your caregiver will not know the cause. However, some uncommon causes include:  °· Abdominal injury. °· Turning a baby that is presenting their buttocks first (breech) or is lying sideways in the uterus (transverse) to a headfirst position (external cephalic version). °· Delivering the first twin. °· Sudden loss of amniotic fluid (premature rupture of the membranes). °· Abnormally short umbilical cord. °SYMPTOMS  °When the placental separation is small, it may not produce symptoms. There may be a small amount of belly (abdominal) pain or slight amount of vaginal bleeding.  °Symptoms of severe problems will depend on the size of the separation and the stage of pregnancy. Symptoms may include:  °· Vaginal bleeding. °· Uterine tenderness. °· Fetal distress detected by fetal monitoring. °· Severe abdominal pain with tenderness. °· Continual uterine contraction (tetany). °· Back pain. °· Maternal shock with severe hemorrhage. °RISK FACTORS °· History of abruption. °· High blood pressure. °· Smoking and alcohol intake. °· Blood clotting problems. °· Too much fluid in the baby's sac (polyhydramnios). °· Twins or more. °· High blood pressure during pregnancy (preeclampsia) or seizures and convulsions (eclampsia). °· Diabetes. °· Having had more than four children. °· Pregnancy in older women (35 years or older). °· Illegal drugs. °· Injury or trauma to the abdomen. °PREVENTION  °Prevention begins with good prenatal  care: °· Stop using alcohol, illegal drugs and smoking. °· Obey traffic laws and practice defensive driving. °· Avoid dangerous activities such as snow and water skiing, horseback riding, motorcycles and mountain climbing. °· Wear seat belts properly and at all times. °· Control high blood pressure and diabetes. °· Avoid situations where there is domestic violence. °DIAGNOSIS  °Placental abruption is suspected when a pregnant woman develops sudden uterine pain with or without bleeding. The uterus usually is very tender and hard. It may be enlarging because of the bleeding and the fetus may show signs of distress. Distress may show up as an abnormal heart rate or rhythm. When your caregiver sees these signs, they may do an ultrasound test to look for a clot behind the placenta. They will also do blood work to make sure there are not clotting problems, signs of too much blood loss, or not enough healthy red blood cells (anemia). These all require a blood transfusion. °TREATMENT  °Treatment depends on many things such as:  °· The amount of bleeding. °· Distress with the baby or mother. °· How far along the pregnancy is. °· The maturity of the baby. °This condition is usually an emergency. When the mother or fetus is in distress, it requires treatment right away to protect the safety of the mother and infant. If the baby is mature and delivery time is near:  °· Careful observation may allow the baby to be delivered vaginally. A vaginal birth is usually preferred over caesarean section unless there is fetal distress. °· Sometimes, a caesarean section cannot be done if there are clotting problems or a DIC. °If the symptoms are severe and   delivery is not about to happen:  °· A cesarean section may be done. This is an operation on the abdomen to remove the baby. °If the symptoms are mild and there are no signs of distress with the baby or mother:  °· You may have to stay in the hospital for a couple of days for  observation. °· You may be given steroid medication to get the baby's lungs mature when necessary. °· If you are Rh negative and the father is Rh positive, you may get Rho-gam to prevent Rh problems in the baby. °· When everything is ok and safe, you may go home and be placed on bed rest. °HOME CARE INSTRUCTIONS  °· Take all medications as directed by your caregiver. °· Keep all your follow-up prenatal visits. °· Arrange for help at home before and after you deliver the baby, especially if you had a Cesarean section or a large amount of bleeding. °· Get plenty of rest and sleep, especially after the baby is born. °· Eat a nutritious and balanced diet. °· Do not have sexual intercourse, use tampons or douche with out your caregiver's permission. °SEEK IMMEDIATE MEDICAL CARE IF: °Before delivery: °· Any type of vaginal bleeding. °· Abdominal pain °· Continuous uterine contractions. °· A hard, tender uterus. °· You do not feel the baby move or the baby has very little movement. °After delivery: °· Started to pass large clots or pieces of tissue. This may be small pieces of placenta left following delivery. °· Noticed that you are soaking more than one sanitary pad per hour, for several hours. °· Heavy, bright-red bleeding which occurs four days or more after delivery. °· A vaginal discharge which has a bad smell. °· An unexplained oral temperature above 100° F (37.8° C). °· Episodes of lightheadedness or fainting. °· Shortness of breath or a rapid heartbeat with very little activity (exertion). °· Abdominal pain. °· Leg or chest pain. °If you are having any of these symptoms, call your caregiver right away. °Document Released: 01/31/2005 Document Revised: 04/25/2011 Document Reviewed: 05/22/2008 °ExitCare® Patient Information ©2014 [employer]. ° °
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
## 2013-07-04 NOTE — ED Notes (Signed)
Notified [facility] of transportation to MAU

## 2013-07-04 NOTE — Progress Notes (Signed)
LR bolus started
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
## 2013-07-04 NOTE — Progress Notes (Signed)
Spoke with Dr. Gaynell FaceMarshall Notified that pt is contracting every 4-7 min. Orders received for LR bolus of 500ml and then infuse at 16125ml/hr.
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
## 2013-07-05 ENCOUNTER — Ambulatory Visit (INDEPENDENT_AMBULATORY_CARE_PROVIDER_SITE_OTHER): Payer: Medicaid Other | Admitting: Advanced Practice Midwife

## 2013-07-05 VITALS — BP 120/80 | HR 101 | Temp 98.2°F | Wt 173.0 lb

## 2013-07-05 DIAGNOSIS — Z3480 Encounter for supervision of other normal pregnancy, unspecified trimester: Secondary | ICD-10-CM
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
## 2013-07-07 NOTE — ED Provider Notes (Signed)
I have personally seen and examined the patient.  I have discussed the plan of care with the resident.  I have reviewed the documentation on PMH/FH/Soc. History.  I have reviewed the documentation of the resident and agree.    Joya Gaskins, MD 07/07/13 1336

## 2013-07-09 ENCOUNTER — Telehealth (HOSPITAL_COMMUNITY): Payer: Self-pay | Admitting: *Deleted

## 2013-07-09 ENCOUNTER — Other Ambulatory Visit: Payer: Self-pay | Admitting: *Deleted

## 2013-07-09 LAB — POCT URINALYSIS DIPSTICK
BILIRUBIN UA: NEGATIVE
Blood, UA: NEGATIVE
GLUCOSE UA: NEGATIVE
Leukocytes, UA: NEGATIVE
NITRITE UA: NEGATIVE
Protein, UA: NEGATIVE
Spec Grav, UA: 1.01
Urobilinogen, UA: NEGATIVE
pH, UA: 5.5

## 2013-07-09 NOTE — Telephone Encounter (Signed)
Preadmission screen  

## 2013-07-10 ENCOUNTER — Ambulatory Visit (HOSPITAL_COMMUNITY)
Admission: RE | Admit: 2013-07-10 | Discharge: 2013-07-10 | Disposition: A | Discharge status: Home / Self Care | Payer: Medicaid Other | Source: Ambulatory Visit | Attending: Advanced Practice Midwife | Admitting: Advanced Practice Midwife

## 2013-07-10 ENCOUNTER — Encounter (HOSPITAL_COMMUNITY): Payer: Self-pay

## 2013-07-10 DIAGNOSIS — E119 Type 2 diabetes mellitus without complications: Secondary | ICD-10-CM | POA: Insufficient documentation

## 2013-07-10 DIAGNOSIS — O24919 Unspecified diabetes mellitus in pregnancy, unspecified trimester: Secondary | ICD-10-CM

## 2013-07-10 DIAGNOSIS — Z3689 Encounter for other specified antenatal screening: Secondary | ICD-10-CM | POA: Insufficient documentation

## 2013-07-12 ENCOUNTER — Ambulatory Visit (INDEPENDENT_AMBULATORY_CARE_PROVIDER_SITE_OTHER): Payer: Medicaid Other | Admitting: Advanced Practice Midwife

## 2013-07-12 VITALS — BP 134/81 | HR 100 | Temp 98.2°F | Wt 171.0 lb

## 2013-07-12 DIAGNOSIS — Z3480 Encounter for supervision of other normal pregnancy, unspecified trimester: Secondary | ICD-10-CM

## 2013-07-12 LAB — POCT URINALYSIS DIPSTICK
Bilirubin, UA: NEGATIVE
Blood, UA: NEGATIVE
GLUCOSE UA: NEGATIVE
Ketones, UA: NEGATIVE
LEUKOCYTES UA: NEGATIVE
Nitrite, UA: NEGATIVE
Protein, UA: NEGATIVE
SPEC GRAV UA: 1.01
UROBILINOGEN UA: NEGATIVE
pH, UA: 5

## 2013-07-12 MED ORDER — TETANUS-DIPHTH-ACELL PERTUSSIS 5-2.5-18.5 LF-MCG/0.5 IM SUSP: 0.5000 mL | Freq: Once | INTRAMUSCULAR | Status: DC

## 2013-07-12 NOTE — Progress Notes (Signed)
Subjective: Lauren [name] is a 28 y.o. at 38 weeks by LMP, 15  Patient denies vaginal leaking of fluid or bleeding, denies contractions.  Reports positive fetal movment.  Denies concerns today. Patient feeling irregular contractions. Reports she is ready for IOL. No questions or concerns.  Objective: Filed Vitals:   07/12/13 1051  BP: 134/81  Pulse: 100  Temp: 98.2 F (36.8 C)   140 FHR 38 Fundal Height Fetal Position cephalic 2/Thick/high/-3, Vertex  Assessment: Patient Active Problem List   Diagnosis Date Noted  . GERD without esophagitis 06/21/2013  . Allergic rhinitis, seasonal 06/21/2013  . GDM, class A2 05/31/2013  . Bartholin's gland cyst 03/19/2013  . Need for rubella vaccination 01/25/2013  . UTI in pregnancy 01/25/2013  . Late prenatal care complicating pregnancy in second trimester 01/22/2013  . H/O gestational diabetes in prior pregnancy, currently pregnant 01/22/2013  . Prior pregnancy complicated by [facility], antepartum 01/22/2013    Plan: Patient to return [facility] Sunday for IOL @ 39 weeks secondary to A2GDM Reviewed warning signs in pregnancy. Patient to call with concerns PRN. Reviewed triage location.  Casimer Russett Wilson Singer CNM

## 2013-07-14 ENCOUNTER — Encounter (HOSPITAL_COMMUNITY): Payer: Self-pay

## 2013-07-14 ENCOUNTER — Inpatient Hospital Stay (HOSPITAL_COMMUNITY)
Admission: RE | Admit: 2013-07-14 | Discharge: 2013-07-16 | DRG: 774 | Disposition: A | Discharge status: Home / Self Care | Payer: Medicaid Other | Source: Ambulatory Visit | Attending: Obstetrics & Gynecology | Admitting: Obstetrics & Gynecology

## 2013-07-14 DIAGNOSIS — O411090 Infection of amniotic sac and membranes, unspecified, unspecified trimester, not applicable or unspecified: Secondary | ICD-10-CM | POA: Diagnosis present

## 2013-07-14 DIAGNOSIS — E119 Type 2 diabetes mellitus without complications: Secondary | ICD-10-CM | POA: Diagnosis present

## 2013-07-14 DIAGNOSIS — O24419 Gestational diabetes mellitus in pregnancy, unspecified control: Secondary | ICD-10-CM | POA: Diagnosis present

## 2013-07-14 DIAGNOSIS — O9902 Anemia complicating childbirth: Secondary | ICD-10-CM | POA: Diagnosis present

## 2013-07-14 DIAGNOSIS — O2432 Unspecified pre-existing diabetes mellitus in childbirth: Principal | ICD-10-CM | POA: Diagnosis present

## 2013-07-14 DIAGNOSIS — O1002 Pre-existing essential hypertension complicating childbirth: Secondary | ICD-10-CM | POA: Diagnosis present

## 2013-07-14 DIAGNOSIS — D573 Sickle-cell trait: Secondary | ICD-10-CM | POA: Diagnosis present

## 2013-07-14 HISTORY — DX: Gestational diabetes mellitus in pregnancy, unspecified control: O24.419

## 2013-07-14 LAB — CBC
HEMATOCRIT: 39.6 % (ref 36.0–46.0)
Hemoglobin: 13.9 g/dL (ref 12.0–15.0)
MCH: 29.4 pg (ref 26.0–34.0)
MCHC: 35.1 g/dL (ref 30.0–36.0)
MCV: 83.7 fL (ref 78.0–100.0)
Platelets: 209 10*3/uL (ref 150–400)
RBC: 4.73 MIL/uL (ref 3.87–5.11)
RDW: 14.2 % (ref 11.5–15.5)
WBC: 7.8 10*3/uL (ref 4.0–10.5)

## 2013-07-14 MED ORDER — ACETAMINOPHEN 325 MG PO TABS: 650.0000 mg | ORAL_TABLET | ORAL | Status: DC | PRN

## 2013-07-14 MED ORDER — LIDOCAINE HCL (PF) 1 % IJ SOLN
30.0000 mL | INTRAMUSCULAR | Status: DC | PRN
  Filled 2013-07-14: qty 30

## 2013-07-14 MED ORDER — OXYTOCIN 40 UNITS IN LACTATED RINGERS INFUSION - SIMPLE MED
1.0000 m[IU]/min | INTRAVENOUS | Status: DC
  Administered 2013-07-15: 1 m[IU]/min via INTRAVENOUS
  Filled 2013-07-14: qty 1000

## 2013-07-14 MED ORDER — LACTATED RINGERS IV SOLN
500.0000 mL | INTRAVENOUS | Status: DC | PRN
  Administered 2013-07-15: 1000 mL via INTRAVENOUS
  Administered 2013-07-15: 500 mL via INTRAVENOUS

## 2013-07-14 MED ORDER — OXYTOCIN 40 UNITS IN LACTATED RINGERS INFUSION - SIMPLE MED
62.5000 mL/h | INTRAVENOUS | Status: DC
Start: 2013-07-14 — End: 2013-07-15

## 2013-07-14 MED ORDER — MISOPROSTOL 25 MCG QUARTER TABLET
25.0000 ug | ORAL_TABLET | ORAL | Status: DC | PRN
  Administered 2013-07-14: 25 ug via VAGINAL
  Filled 2013-07-14: qty 0.25
  Filled 2013-07-14: qty 1

## 2013-07-14 MED ORDER — OXYCODONE-ACETAMINOPHEN 5-325 MG PO TABS: 1.0000 | ORAL_TABLET | ORAL | Status: DC | PRN

## 2013-07-14 MED ORDER — TERBUTALINE SULFATE 1 MG/ML IJ SOLN: 0.2500 mg | Freq: Once | INTRAMUSCULAR | Status: AC | PRN

## 2013-07-14 MED ORDER — IBUPROFEN 600 MG PO TABS: 600.0000 mg | ORAL_TABLET | Freq: Four times a day (QID) | ORAL | Status: DC | PRN

## 2013-07-14 MED ORDER — HYDROXYZINE HCL 50 MG PO TABS
50.0000 mg | ORAL_TABLET | Freq: Four times a day (QID) | ORAL | Status: DC | PRN
  Filled 2013-07-14: qty 1

## 2013-07-14 MED ORDER — ONDANSETRON HCL 4 MG/2ML IJ SOLN: 4.0000 mg | Freq: Four times a day (QID) | INTRAMUSCULAR | Status: DC | PRN

## 2013-07-14 MED ORDER — CITRIC ACID-SODIUM CITRATE 334-500 MG/5ML PO SOLN: 30.0000 mL | ORAL | Status: DC | PRN

## 2013-07-14 MED ORDER — BUTORPHANOL TARTRATE 1 MG/ML IJ SOLN
1.0000 mg | INTRAMUSCULAR | Status: DC | PRN
  Administered 2013-07-15 (×3): 1 mg via INTRAVENOUS
  Filled 2013-07-14 (×3): qty 1

## 2013-07-14 MED ORDER — LACTATED RINGERS IV SOLN
INTRAVENOUS | Status: DC
  Administered 2013-07-14 – 2013-07-15 (×3): via INTRAVENOUS

## 2013-07-14 MED ORDER — OXYTOCIN BOLUS FROM INFUSION
500.0000 mL | INTRAVENOUS | Status: DC
  Administered 2013-07-15: 500 mL via INTRAVENOUS

## 2013-07-15 ENCOUNTER — Encounter (HOSPITAL_COMMUNITY): Payer: Self-pay

## 2013-07-15 ENCOUNTER — Inpatient Hospital Stay (HOSPITAL_COMMUNITY): Payer: Medicaid Other | Admitting: Anesthesiology

## 2013-07-15 ENCOUNTER — Encounter (HOSPITAL_COMMUNITY): Payer: Medicaid Other | Admitting: Anesthesiology

## 2013-07-15 LAB — RPR

## 2013-07-15 LAB — GLUCOSE, CAPILLARY
Glucose-Capillary: 92 mg/dL (ref 70–99)
Glucose-Capillary: 84 mg/dL (ref 70–99)

## 2013-07-15 LAB — TYPE AND SCREEN
ABO/RH(D): O NEG
Antibody Screen: NEGATIVE

## 2013-07-15 MED ORDER — SODIUM CHLORIDE 0.9 % IV SOLN
INTRAVENOUS | Status: DC
  Filled 2013-07-15: qty 1

## 2013-07-15 MED ORDER — DIPHENHYDRAMINE HCL 50 MG/ML IJ SOLN: 12.5000 mg | INTRAMUSCULAR | Status: DC | PRN

## 2013-07-15 MED ORDER — PHENYLEPHRINE 40 MCG/ML (10ML) SYRINGE FOR IV PUSH (FOR BLOOD PRESSURE SUPPORT)
80.0000 ug | PREFILLED_SYRINGE | INTRAVENOUS | Status: DC | PRN
  Filled 2013-07-15: qty 2

## 2013-07-15 MED ORDER — FENTANYL 2.5 MCG/ML BUPIVACAINE 1/10 % EPIDURAL INFUSION (WH - ANES)
INTRAMUSCULAR | Status: DC | PRN
  Administered 2013-07-15: 14 mL/h via EPIDURAL

## 2013-07-15 MED ORDER — PRENATAL MULTIVITAMIN CH
1.0000 | ORAL_TABLET | Freq: Every day | ORAL | Status: DC
  Filled 2013-07-15: qty 1

## 2013-07-15 MED ORDER — TETANUS-DIPHTH-ACELL PERTUSSIS 5-2.5-18.5 LF-MCG/0.5 IM SUSP: 0.5000 mL | Freq: Once | INTRAMUSCULAR | Status: DC

## 2013-07-15 MED ORDER — EPHEDRINE 5 MG/ML INJ
10.0000 mg | INTRAVENOUS | Status: DC | PRN
  Filled 2013-07-15: qty 4
  Filled 2013-07-15: qty 2

## 2013-07-15 MED ORDER — FERROUS SULFATE 325 (65 FE) MG PO TABS
325.0000 mg | ORAL_TABLET | Freq: Two times a day (BID) | ORAL | Status: DC
  Administered 2013-07-15 – 2013-07-16 (×2): 325 mg via ORAL
  Filled 2013-07-15 (×2): qty 1

## 2013-07-15 MED ORDER — OXYCODONE-ACETAMINOPHEN 5-325 MG PO TABS
1.0000 | ORAL_TABLET | ORAL | Status: DC | PRN
  Administered 2013-07-15: 1 via ORAL
  Filled 2013-07-15: qty 1

## 2013-07-15 MED ORDER — BENZOCAINE-MENTHOL 20-0.5 % EX AERO: 1.0000 "application " | INHALATION_SPRAY | CUTANEOUS | Status: DC | PRN

## 2013-07-15 MED ORDER — ONDANSETRON HCL 4 MG PO TABS
4.0000 mg | ORAL_TABLET | ORAL | Status: DC | PRN
  Administered 2013-07-15: 4 mg via ORAL
  Filled 2013-07-15: qty 1

## 2013-07-15 MED ORDER — MEASLES, MUMPS & RUBELLA VAC ~~LOC~~ INJ
0.5000 mL | INJECTION | Freq: Once | SUBCUTANEOUS | Status: AC
  Administered 2013-07-16: 0.5 mL via SUBCUTANEOUS
  Filled 2013-07-15 (×2): qty 0.5

## 2013-07-15 MED ORDER — SENNOSIDES-DOCUSATE SODIUM 8.6-50 MG PO TABS
2.0000 | ORAL_TABLET | ORAL | Status: DC
  Administered 2013-07-16: 2 via ORAL
  Filled 2013-07-15: qty 2

## 2013-07-15 MED ORDER — LIDOCAINE HCL (PF) 1 % IJ SOLN
INTRAMUSCULAR | Status: DC | PRN
  Administered 2013-07-15: 9 mL
  Administered 2013-07-15: 5 mL

## 2013-07-15 MED ORDER — SODIUM CHLORIDE 0.9 % IV SOLN: INTRAVENOUS | Status: DC

## 2013-07-15 MED ORDER — IBUPROFEN 600 MG PO TABS
600.0000 mg | ORAL_TABLET | Freq: Four times a day (QID) | ORAL | Status: DC
  Administered 2013-07-15 – 2013-07-16 (×4): 600 mg via ORAL
  Filled 2013-07-15 (×4): qty 1

## 2013-07-15 MED ORDER — DIBUCAINE 1 % RE OINT: 1.0000 "application " | TOPICAL_OINTMENT | RECTAL | Status: DC | PRN

## 2013-07-15 MED ORDER — PHENYLEPHRINE 40 MCG/ML (10ML) SYRINGE FOR IV PUSH (FOR BLOOD PRESSURE SUPPORT)
80.0000 ug | PREFILLED_SYRINGE | INTRAVENOUS | Status: DC | PRN
  Filled 2013-07-15: qty 2
  Filled 2013-07-15: qty 10

## 2013-07-15 MED ORDER — DEXTROSE 50 % IV SOLN: 25.0000 mL | INTRAVENOUS | Status: DC | PRN

## 2013-07-15 MED ORDER — FENTANYL 2.5 MCG/ML BUPIVACAINE 1/10 % EPIDURAL INFUSION (WH - ANES)
14.0000 mL/h | INTRAMUSCULAR | Status: DC | PRN
  Filled 2013-07-15: qty 125

## 2013-07-15 MED ORDER — LACTATED RINGERS IV SOLN
500.0000 mL | Freq: Once | INTRAVENOUS | Status: AC
  Administered 2013-07-15: 500 mL via INTRAVENOUS

## 2013-07-15 MED ORDER — ZOLPIDEM TARTRATE 5 MG PO TABS: 5.0000 mg | ORAL_TABLET | Freq: Every evening | ORAL | Status: DC | PRN

## 2013-07-15 MED ORDER — ONDANSETRON HCL 4 MG/2ML IJ SOLN: 4.0000 mg | INTRAMUSCULAR | Status: DC | PRN

## 2013-07-15 MED ORDER — LANOLIN HYDROUS EX OINT: TOPICAL_OINTMENT | CUTANEOUS | Status: DC | PRN

## 2013-07-15 MED ORDER — DIPHENHYDRAMINE HCL 25 MG PO CAPS: 25.0000 mg | ORAL_CAPSULE | Freq: Four times a day (QID) | ORAL | Status: DC | PRN

## 2013-07-15 MED ORDER — EPHEDRINE 5 MG/ML INJ
10.0000 mg | INTRAVENOUS | Status: DC | PRN
  Filled 2013-07-15: qty 2

## 2013-07-15 MED ORDER — MAGNESIUM HYDROXIDE 400 MG/5ML PO SUSP: 30.0000 mL | ORAL | Status: DC | PRN

## 2013-07-15 MED ORDER — WITCH HAZEL-GLYCERIN EX PADS: 1.0000 "application " | MEDICATED_PAD | CUTANEOUS | Status: DC | PRN

## 2013-07-15 NOTE — Anesthesia Preprocedure Evaluation (Signed)
Anesthesia Evaluation  Patient identified by MRN, date of birth, ID band Patient awake    Reviewed: Allergy & Precautions, H&P , NPO status , Patient's Chart, lab work & pertinent test results  Airway Mallampati: I TM Distance: >3 FB Neck ROM: full    Dental no notable dental hx.    Pulmonary neg pulmonary ROS,    Pulmonary exam normal       Cardiovascular hypertension, negative cardio ROS      Neuro/Psych negative psych ROS   GI/Hepatic Neg liver ROS, GERD-  Medicated and Controlled,  Endo/Other    Renal/GU negative Renal ROS     Musculoskeletal   Abdominal Normal abdominal exam  (+)   Peds  Hematology negative hematology ROS (+)   Anesthesia Other Findings   Reproductive/Obstetrics (+) Pregnancy                           Anesthesia Physical Anesthesia Plan  ASA: II  Anesthesia Plan: Epidural   Post-op Pain Management:    Induction:   Airway Management Planned:   Additional Equipment:   Intra-op Plan:   Post-operative Plan:   Informed Consent: I have reviewed the patients History and Physical, chart, labs and discussed the procedure including the risks, benefits and alternatives for the proposed anesthesia with the patient or authorized representative who has indicated his/her understanding and acceptance.     Plan Discussed with:   Anesthesia Plan Comments:         Anesthesia Quick Evaluation

## 2013-07-15 NOTE — Anesthesia Procedure Notes (Signed)
Epidural Patient location during procedure: OB Start time: 07/15/2013 7:52 AM End time: 07/15/2013 7:56 AM  Staffing Anesthesiologist: Leilani Able Performed by: anesthesiologist   Preanesthetic Checklist Completed: patient identified, surgical consent, pre-op evaluation, timeout performed, IV checked, risks and benefits discussed and monitors and equipment checked  Epidural Patient position: sitting Prep: site prepped and draped and DuraPrep Patient monitoring: continuous pulse ox and blood pressure Approach: midline Location: L3-L4 Injection technique: LOR air  Needle:  Needle type: Tuohy  Needle gauge: 17 G Needle length: 9 cm and 9 Needle insertion depth: 5 cm cm Catheter type: closed end flexible Catheter size: 19 Gauge Catheter at skin depth: 10 cm Test dose: negative  Assessment Sensory level: T10 Events: blood not aspirated, injection not painful, no injection resistance, negative IV test and no paresthesia  Additional Notes Reason for block:procedure for pain

## 2013-07-15 NOTE — Progress Notes (Signed)
Pt reported to RN feeling constant right sided pain over last few minutes.  RN palpated abdomen during and after several UCs and did not feel any uterine rigidity in between contractions.  After palpation, pt notified RN that pain was no longer present in between UCs.  RN assessed UC and abdomen for several more minutes while explaining significance of constant pain, and explained to pt to notify RN immediately if constant pain returns.  RN helped pt flip to right side and pt pushed epidural PCA button.

## 2013-07-15 NOTE — H&P (Signed)
Lauren [name] is a 28 y.o. female presenting for IOL for A2  Diabetes at [redacted] weeks gestation. Maternal Medical History:  Fetal activity: Perceived fetal activity is normal.   Last perceived fetal movement was within the past hour.    Prenatal Complications - Diabetes: type 2.    OB History   Grav Para Term Preterm Abortions TAB SAB Ect Mult Living   4 2 1 1 1 1  0 0 0 2     Past Medical History  Diagnosis Date  . Pregnancy induced hypertension   . Headache(784.0)   . Sickle cell trait   . Urinary tract infection   . SROM (spontaneous rupture of membranes) 05/11/2011  . Allergic disorder   . Bartholin cyst   . Irregular menstrual cycle    Past Surgical History  Procedure Laterality Date  . Therapeutic abortion     Family History: family history is negative for Alcohol abuse, Arthritis, Asthma, Birth defects, Cancer, COPD, Depression, Diabetes, Drug abuse, Early death, Hearing loss, Heart disease, Hyperlipidemia, Hypertension, Kidney disease, Learning disabilities, Mental illness, Mental retardation, Miscarriages / Stillbirths, Stroke, and Vision loss. Social History:  reports that she has never smoked. She has never used smokeless tobacco. She reports that she does not drink alcohol or use illicit drugs.   Prenatal Transfer Tool  Maternal Diabetes: Yes:  Diabetes Type:  Insulin/Medication controlled Genetic Screening: Normal Maternal Ultrasounds/Referrals: Normal Fetal Ultrasounds or other Referrals:  Referred to Materal Fetal Medicine  Maternal Substance Abuse:  No Significant Maternal Medications:  Meds include: Other: Glyburide Significant Maternal Lab Results:  None Other Comments:  None  ROS  Dilation: 5 Effacement (%): 80 Station: -2 Exam by:: L.Stubbs, RN Blood pressure 120/79, pulse 90, temperature 98.4 F (36.9 C), temperature source Oral, resp. rate 18, height 5\' 3"  (1.6 m), weight 175 lb (79.379 kg), last menstrual period 10/14/2012, unknown if currently  breastfeeding. Exam Physical Exam  Prenatal labs: ABO, Rh: --/--/O NEG (05/31 2220) Antibody: NEG (05/31 2220) Rubella: 0.80 (12/09 1458) RPR: NON REAC (05/31 2020)  HBsAg: NEGATIVE (12/09 1458)  HIV: NON REACTIVE (03/18 1321)  GBS: NOT DETECTED (05/15 1328)   Assessment/Plan: 39 weeks.  A2 Diabetes.  2 stage IOL.   Brock Bad 07/15/2013, 6:50 AM

## 2013-07-15 NOTE — Progress Notes (Signed)
Lauren [name] is a 28 y.o. [id number] at [redacted]w[redacted]d by LMP admitted for induction of labor due to Gestational diabetes.  Subjective: Comfortable  Objective: BP 115/68  Pulse 67  Temp(Src) 98.2 F (36.8 C) (Oral)  Resp 20  Ht 5\' 3"  (1.6 m)  Wt 79.379 kg (175 lb)  BMI 31.01 kg/m2  SpO2 98%  LMP 10/14/2012    CBG (last 3)   Recent Labs  07/15/13 1010  GLUCAP 92        FHT:  FHR: 130 bpm, variability: moderate,  accelerations:  Present,  decelerations:  Present moderate variable decelerations UC:   irregular, every 5 minutes SVE:   Dilation: 5 Effacement (%): 80 Station: -2 Exam by:: Dr Tamela Oddi AROM-->por wine fluid; internal monitors placed Labs: Lab Results  Component Value Date   WBC 7.8 07/14/2013   HGB 13.9 07/14/2013   HCT 39.6 07/14/2013   MCV 83.7 07/14/2013   PLT 209 07/14/2013    Assessment / Plan: Latent labor; ?marginal abruption  Labor: see above Preeclampsia:  borderline B/P--labs in range Fetal Wellbeing:  Category II; LTV and previous accelerations consistent w/fetal well being; monitor closely Pain Control:  Epidural I/D:  n/a Anticipated MOD:  NSVD GDM on an oral agent:  CBG in range Antionette Char 07/15/2013, 11:17 AM

## 2013-07-15 NOTE — Lactation Note (Signed)
This note was copied from the chart of Lauren [name]. Lactation Consultation Note  Patient Name: Lauren Niang Domm LFYBO'F Date: 07/15/2013 Reason for consult: Initial assessment  Infant STS with mom upon entering room.  Mom history of GDM on Glyburide; infant's CBGs have been 44, 45, and 61 (61 @1808  prior to bath).  Infant had just fed 1 hr prior to entering room for 20 minutes per mom.  Mom stated last feeding was a "good one"; mom denies any pain with feedings.  Mom is experienced breastfeeder x2 previous children (5 month-1 yr experience).  Infant is a 39.1 week, 7lbs,9oz infant Lauren who has breastfed x2 (20 min each) + 1 (5 min) + 2 attempts (STS) since birth; 7 hrs old; voids-0; stools-2.  LC brought mom spoons, foley cup, and curved tip syringe for feeding EBM if needed.  Taught mom how to use.  Mom receptive to teaching and option to use if needed.  Mom stated she knew how to hand express and denied needing any review from [facility].  Asked questions regarding breastfeeding and returning to work.  Lactation brochure given and informed of outpatient services and hospital support group.  Encouraged to call if needed for assistance.     Maternal Data Formula Feeding for Exclusion: No Infant to breast within first hour of birth: Yes Has patient been taught Hand Expression?: No (patient stated she knows how and denied needing any help with HE from [facility]) Does the patient have breastfeeding experience prior to this delivery?: Yes  Feeding Feeding Type: Breast Fed Length of feed: 20 min  LATCH Score/Interventions                      Lactation Tools Discussed/Used WIC Program: Yes   Consult Status Consult Status: Follow-up Date: 07/16/13 Follow-up type: In-patient    Claiborne Rigg Zenas Santa 07/15/2013, 7:52 PM

## 2013-07-16 ENCOUNTER — Other Ambulatory Visit (HOSPITAL_COMMUNITY): Payer: Self-pay | Admitting: Advanced Practice Midwife

## 2013-07-16 ENCOUNTER — Encounter: Payer: Self-pay | Admitting: Advanced Practice Midwife

## 2013-07-16 DIAGNOSIS — Z6791 Unspecified blood type, Rh negative: Secondary | ICD-10-CM

## 2013-07-16 DIAGNOSIS — O26899 Other specified pregnancy related conditions, unspecified trimester: Secondary | ICD-10-CM | POA: Insufficient documentation

## 2013-07-16 LAB — CBC
HEMATOCRIT: 37.9 % (ref 36.0–46.0)
Hemoglobin: 12.8 g/dL (ref 12.0–15.0)
MCH: 28.8 pg (ref 26.0–34.0)
MCHC: 33.8 g/dL (ref 30.0–36.0)
MCV: 85.2 fL (ref 78.0–100.0)
Platelets: 176 10*3/uL (ref 150–400)
RBC: 4.45 MIL/uL (ref 3.87–5.11)
RDW: 14.2 % (ref 11.5–15.5)
WBC: 9.4 10*3/uL (ref 4.0–10.5)

## 2013-07-16 LAB — KLEIHAUER-BETKE STAIN
# Vials RhIg: 1
Fetal Cells %: 0 %
Quantitation Fetal Hemoglobin: 0 mL

## 2013-07-16 LAB — GLUCOSE, CAPILLARY: Glucose-Capillary: 79 mg/dL (ref 70–99)

## 2013-07-16 MED ORDER — IBUPROFEN 600 MG PO TABS: 600.0000 mg | ORAL_TABLET | Freq: Four times a day (QID) | ORAL | Status: DC

## 2013-07-16 MED ORDER — RHO D IMMUNE GLOBULIN 1500 UNIT/2ML IJ SOSY
300.0000 ug | PREFILLED_SYRINGE | Freq: Once | INTRAMUSCULAR | Status: AC
  Administered 2013-07-16: 300 ug via INTRAMUSCULAR
  Filled 2013-07-16: qty 2

## 2013-07-16 NOTE — Anesthesia Postprocedure Evaluation (Signed)
  Anesthesia Post-op Note Anesthesia Post Note  Patient: Lauren [name]  Procedure(s) Performed: * No procedures listed *  Anesthesia type: Epidural  Patient location: Mother/Baby  Post pain: Pain level controlled  Post assessment: Post-op Vital signs reviewed  Last Vitals:  Filed Vitals:   07/16/13 0530  BP: 115/76  Pulse: 81  Temp: 36.6 C  Resp: 18    Post vital signs: Reviewed  Level of consciousness:alert  Complications: No apparent anesthesia complications

## 2013-07-16 NOTE — Lactation Note (Signed)
This note was copied from the chart of Lauren [name]. Lactation Consultation Note  Mother denies soreness or problems.  ExBF.  LS10.  P3.   Reviewed engorgement care, pacifier use and provided mother with a hand pump. Encouraged mother to call if further assistance is needed.  Patient Name: Lauren [name] NATFT'D Date: 07/16/2013     Maternal Data    Feeding Feeding Type: Breast Fed Length of feed: 20 min  LATCH Score/Interventions                      Lactation Tools Discussed/Used     Consult Status Consult Status: Complete    Hardie Pulley 07/16/2013, 9:14 AM

## 2013-07-17 LAB — RH IG WORKUP (INCLUDES ABO/RH)
ABO/RH(D): O NEG
Gestational Age(Wks): 39.1
Unit division: 0

## 2013-07-17 NOTE — Progress Notes (Signed)
Ur chart review completed.  

## 2013-07-20 NOTE — Discharge Summary (Signed)
Obstetric Discharge Summary Reason for Admission: induction of labor Prenatal Procedures: none Intrapartum Procedures: spontaneous vaginal delivery Postpartum Procedures: none Complications-Operative and Postpartum: none Hemoglobin  Date Value Ref Range Status  07/16/2013 12.8  12.0 - 15.0 g/dL Final     HCT  Date Value Ref Range Status  07/16/2013 37.9  36.0 - 46.0 % Final    Physical Exam:  General: alert and cooperative Lochia: appropriate Uterine Fundus: firm Incision: NA DVT Evaluation: No evidence of DVT seen on physical exam.  Discharge Diagnoses: Term Pregnancy-delivered  Discharge Information: Date: 07/20/2013 Activity: pelvic rest Diet: routine Medications: Ibuprofen Condition: stable Instructions: refer to practice specific booklet Discharge to: home   Newborn Data: Live born female  Birth Weight: 7 lb 9.7 oz (3450 g) APGAR: 9, 9  Home with mother.  Lauren [name] Dessa Phi 07/20/2013, 2:39 PM

## 2013-08-20 ENCOUNTER — Ambulatory Visit (INDEPENDENT_AMBULATORY_CARE_PROVIDER_SITE_OTHER): Payer: Medicaid Other | Admitting: Advanced Practice Midwife

## 2013-08-20 ENCOUNTER — Encounter: Payer: Self-pay | Admitting: Advanced Practice Midwife

## 2013-08-20 NOTE — Progress Notes (Signed)
  Subjective:     Lauren GristChristine A [name] is a 28 y.o. female who presents for a postpartum visit. She is 5 weeks postpartum following a spontaneous vaginal delivery. I have fully reviewed the prenatal and intrapartum course. The delivery was at term gestational weeks. Outcome: spontaneous vaginal delivery. Anesthesia: epidural. Postpartum course has been stable. Baby's course has been stable, although the patient feels he may be colicky. States gripe water has helped. Baby is feeding by bottle - formula. Bleeding thin lochia. Bowel function is normal. Bladder function is normal. Patient is not sexually active. Contraception method is abstinence. Postpartum depression screening: negative.  The following portions of the patient's history were reviewed and updated as appropriate: allergies, current medications, past family history, past medical history, past social history, past surgical history and problem list.  Review of Systems A comprehensive review of systems was negative.  Constitutional: negative for fatigue and weight loss Respiratory: negative for cough and wheezing Cardiovascular: negative for chest pain, fatigue and palpitations Gastrointestinal: negative for abdominal pain and change in bowel habits Genitourinary:negative Integument/breast: negative for nipple discharge Musculoskeletal:negative for myalgias Neurological: negative for gait problems and tremors Behavioral/Psych: negative for abusive relationship, depression Endocrine: negative for temperature intolerance       Objective:    BP 121/82  Pulse 79  Temp(Src) 98.7 F (37.1 C)  Ht 5\' 3"  (1.6 m)  Wt 149 lb (67.586 kg)  BMI 26.40 kg/m2  Breastfeeding? No  General:  alert and cooperative   Breasts:  inspection negative, no nipple discharge or bleeding, no masses or nodularity palpable        Abdomen: soft, non-tender; bowel sounds normal; no masses,  no organomegaly   Vulva:  normal           Adnexa:  normal adnexa            Assessment:     5 postpartum exam. Pap smear not done at today's visit.  Bottle Feeding Candidate for IUD Patient Active Problem List   Diagnosis Date Noted  . Rh negative, delivered, current hospitalization 07/16/2013  . Normal delivery 07/15/2013  . GDM (gestational diabetes mellitus) 07/14/2013  . GERD without esophagitis 06/21/2013  . Allergic rhinitis, seasonal 06/21/2013  . GDM, class A2 05/31/2013  . Bartholin's gland cyst 03/19/2013  . Need for rubella vaccination 01/25/2013  . UTI in pregnancy 01/25/2013  . Late prenatal care complicating pregnancy in second trimester 01/22/2013  . H/O gestational diabetes in prior pregnancy, currently pregnant 01/22/2013  . [facility]     Plan:    1. Contraception: patient desires IUD, plan at next visit. 2. Pap was negative 01/2013.  3. Follow up in: 1 week or as needed. For Mirena IUD.  Merlyn Conley Wilson SingerWren CNM

## 2013-08-23 ENCOUNTER — Ambulatory Visit (INDEPENDENT_AMBULATORY_CARE_PROVIDER_SITE_OTHER): Payer: Medicaid Other | Admitting: Advanced Practice Midwife

## 2013-08-23 ENCOUNTER — Encounter: Payer: Self-pay | Admitting: Advanced Practice Midwife

## 2013-08-23 VITALS — BP 111/68 | HR 75 | Temp 98.0°F | Ht 63.0 in | Wt 152.0 lb

## 2013-08-23 DIAGNOSIS — Z3202 Encounter for pregnancy test, result negative: Secondary | ICD-10-CM

## 2013-08-23 DIAGNOSIS — Z30430 Encounter for insertion of intrauterine contraceptive device: Secondary | ICD-10-CM

## 2013-08-23 NOTE — Progress Notes (Signed)
IUD Procedure Note   DIAGNOSIS: Desires long-term, reversible contraception   PROCEDURE: IUD placement Performing Provider: Dory HornAmy Mychael Soots CNM  Patient counseled prior to procedure. I explained risks and benefits of Mirena IUD, reviewed alternative forms of contraception. Patient stated understanding and consented to continue with procedure.   LMP: postpartum Pregnancy Test: Negative Lot #: [id number] Expiration Date: 09/2015   IUD type: [X]  Mirena    PROCEDURE:  Timeout procedure was performed to ensure right patient and right site.  A bimanual exam was performed to determine the position of the uterus, retroverted. The speculum was placed. The vagina and cervix was sterilized in the usual manner and sterile technique was maintained throughout the course of the procedure. A single toothed tenaculum was applied to the posterior lip of the cervix and gentle traction applied. The depth of the uterus was sounded to 9cm. With gentle traction on the tenaculum, the IUD was inserted to the appropriate depth and inserted without difficulty.  The string was cut to an estimated 3 cm length. Bleeding was minimal. The patient tolerated the procedure well.   Follow up: The patient tolerated the procedure well without complications.  Standard post-procedure care is explained and return precautions are given. Patient given 600 mg Ibuprofen PO, Once.  Loki Wuthrich Wilson SingerWren CNM

## 2013-08-26 ENCOUNTER — Encounter: Payer: Self-pay | Admitting: *Deleted

## 2013-08-27 LAB — POCT URINE PREGNANCY: Preg Test, Ur: NEGATIVE

## 2013-08-27 NOTE — Addendum Note (Signed)
Addended by: Odessa FlemingBOHNE, Bright Spielmann M on: 08/27/2013 10:18 AM   Modules accepted: Orders

## 2013-09-24 ENCOUNTER — Telehealth: Payer: Self-pay | Admitting: *Deleted

## 2013-09-24 NOTE — Telephone Encounter (Signed)
IUD problems 5:02 Spoke with patient she is concerned that she can not feel her IUD strings anymore. Patient assessed: no bleeding or pain since insertion. Patient reassured- strings may have softneed and curled around the cervix. Patient will keep checking monthly to see if she can feel them and she will call if she wants the provider to look for her. She is ok with that plan.

## 2013-10-06 ENCOUNTER — Other Ambulatory Visit: Payer: Self-pay | Admitting: Advanced Practice Midwife

## 2013-10-06 DIAGNOSIS — N946 Dysmenorrhea, unspecified: Secondary | ICD-10-CM

## 2013-10-08 ENCOUNTER — Other Ambulatory Visit: Payer: Self-pay | Admitting: Obstetrics

## 2013-10-08 DIAGNOSIS — N946 Dysmenorrhea, unspecified: Secondary | ICD-10-CM

## 2013-10-08 MED ORDER — IBUPROFEN 600 MG PO TABS: 600.0000 mg | ORAL_TABLET | Freq: Four times a day (QID) | ORAL | Status: DC | PRN

## 2013-10-08 MED ORDER — IBUPROFEN 600 MG PO TABS: 600.0000 mg | ORAL_TABLET | Freq: Four times a day (QID) | ORAL | Status: DC

## 2013-11-12 ENCOUNTER — Telehealth: Payer: Self-pay | Admitting: *Deleted

## 2013-11-12 DIAGNOSIS — B373 Candidiasis of vulva and vagina: Secondary | ICD-10-CM

## 2013-11-12 DIAGNOSIS — B3731 Acute candidiasis of vulva and vagina: Secondary | ICD-10-CM

## 2013-11-12 MED ORDER — FLUCONAZOLE 150 MG PO TABS: 150.0000 mg | ORAL_TABLET | ORAL | Status: DC

## 2013-11-12 NOTE — Telephone Encounter (Signed)
Patient states she has a yeast infection and would like treatment sent to her pharmacy. She has used the Diflucan in the past and she would like that. 3:41 Spoke with patient and she wants something for yeast. Rx sent to pharmacy per office protocol.

## 2013-11-29 ENCOUNTER — Other Ambulatory Visit: Payer: Self-pay

## 2013-12-16 ENCOUNTER — Encounter: Payer: Self-pay | Admitting: Advanced Practice Midwife

## 2014-03-20 ENCOUNTER — Other Ambulatory Visit: Payer: Self-pay | Admitting: Advanced Practice Midwife

## 2014-03-20 NOTE — Telephone Encounter (Signed)
Please advise 

## 2014-04-10 ENCOUNTER — Emergency Department (HOSPITAL_COMMUNITY)
Admission: EM | Admit: 2014-04-10 | Discharge: 2014-04-10 | Disposition: A | Discharge status: Home / Self Care | Payer: 59 | Source: Home / Self Care | Attending: Family Medicine | Admitting: Family Medicine

## 2014-04-10 ENCOUNTER — Encounter (HOSPITAL_COMMUNITY): Payer: Self-pay | Admitting: Emergency Medicine

## 2014-04-10 DIAGNOSIS — B379 Candidiasis, unspecified: Secondary | ICD-10-CM

## 2014-04-10 DIAGNOSIS — L509 Urticaria, unspecified: Secondary | ICD-10-CM

## 2014-04-10 MED ORDER — PREDNISONE 5 MG PO KIT: PACK | ORAL | Status: DC

## 2014-04-10 MED ORDER — FLUCONAZOLE 150 MG PO TABS: 150.0000 mg | ORAL_TABLET | Freq: Once | ORAL | Status: DC

## 2014-04-10 MED ORDER — TRIAMCINOLONE ACETONIDE 0.1 % EX CREA: 1.0000 "application " | TOPICAL_CREAM | Freq: Two times a day (BID) | CUTANEOUS | Status: DC

## 2014-04-10 NOTE — Discharge Instructions (Signed)
Thank you for coming in today. Take the prednisone as directed and use the triamcinolone cream on the rash. Take 2 Benadryl (50 mg) at bedtime tonight Take one cetirizine during the day for itching. Use [place] Itch over-the-counter as needed for temporary control of itching Call or go to the emergency room if you get worse, have trouble breathing, have chest pains, or palpitations.   Hives Hives are itchy, red, swollen areas of the skin. They can vary in size and location on your body. Hives can come and go for hours or several days (acute hives) or for several weeks (chronic hives). Hives do not spread from person to person (noncontagious). They may get worse with scratching, exercise, and emotional stress. CAUSES   Allergic reaction to food, additives, or drugs.  Infections, including the common cold.  Illness, such as vasculitis, lupus, or thyroid disease.  Exposure to sunlight, heat, or cold.  Exercise.  Stress.  Contact with chemicals. SYMPTOMS   Red or white swollen patches on the skin. The patches may change size, shape, and location quickly and repeatedly.  Itching.  Swelling of the hands, feet, and face. This may occur if hives develop deeper in the skin. DIAGNOSIS  Your caregiver can usually tell what is wrong by performing a physical exam. Skin or blood tests may also be done to determine the cause of your hives. In some cases, the cause cannot be determined. TREATMENT  Mild cases usually get better with medicines such as antihistamines. Severe cases may require an emergency epinephrine injection. If the cause of your hives is known, treatment includes avoiding that trigger.  HOME CARE INSTRUCTIONS   Avoid causes that trigger your hives.  Take antihistamines as directed by your caregiver to reduce the severity of your hives. Non-sedating or low-sedating antihistamines are usually recommended. Do not drive while taking an antihistamine.  Take any other medicines  prescribed for itching as directed by your caregiver.  Wear loose-fitting clothing.  Keep all follow-up appointments as directed by your caregiver. SEEK MEDICAL CARE IF:   You have persistent or severe itching that is not relieved with medicine.  You have painful or swollen joints. SEEK IMMEDIATE MEDICAL CARE IF:   You have a fever.  Your tongue or lips are swollen.  You have trouble breathing or swallowing.  You feel tightness in the throat or chest.  You have abdominal pain. These problems may be the first sign of a life-threatening allergic reaction. Call your local emergency services (911 in [place] MAKE SURE YOU:   Understand these instructions.  Will watch your condition.  Will get help right away if you are not doing well or get worse. Document Released: 01/31/2005 Document Revised: 02/05/2013 Document Reviewed: 04/26/2011 [facility] Patient Information 2015 [place] [place]. This information is not intended to replace advice given to you by your health care provider. Make sure you discuss any questions you have with your health care provider.

## 2014-04-10 NOTE — ED Notes (Signed)
Pt triaged and assessed by provider.   Provider in before nurse. 

## 2014-04-10 NOTE — ED Provider Notes (Addendum)
Lauren [name] is a 29 y.o. female who presents to Urgent Care today for rash. Patient has a pruritic rash on her upper extremities and chest starting today. Symptoms worsened today at work. She denies any trouble breathing fevers chills nausea vomiting or diarrhea. No new medication soaps detergents , shampoos cosmetics. No new foods. She has not tried any medications yet.  Additionally patient is experiencing a vaginal yeast infection. She tends to get one of these every time she has a menstrual period. Her symptoms just started in her itchy. Typically she is prescribed fluconazole. She would like a refill of this medication if possible.   Past Medical History  Diagnosis Date  . Pregnancy induced hypertension     with pregnancy in 2005  . Headache(784.0)   . Sickle cell trait   . Urinary tract infection   . SROM (spontaneous rupture of membranes) 05/11/2011  . Allergic disorder   . Bartholin cyst   . Irregular menstrual cycle   . Gestational diabetes     2015   Past Surgical History  Procedure Laterality Date  . Therapeutic abortion     History  Substance Use Topics  . Smoking status: Never Smoker   . Smokeless tobacco: Never Used  . Alcohol Use: No   ROS as above Medications: No current facility-administered medications for this encounter.   Current Outpatient Prescriptions  Medication Sig Dispense Refill  . ibuprofen (ADVIL,MOTRIN) 600 MG tablet Take 1 tablet (600 mg total) by mouth every 6 (six) hours. 30 tablet 5  . PredniSONE 5 MG KIT 12 day dosepack po 1 kit 0  . triamcinolone cream (KENALOG) 0.1 % Apply 1 application topically 2 (two) times daily. 30 g 0   No Known Allergies   Exam:  BP 140/82 mmHg  Pulse 74  Temp(Src) 98.6 F (37 C) (Oral)  Resp 16  SpO2 98% Gen: Well NAD HEENT: EOMI,  MMM no tongue or lip swelling Lungs: Normal work of breathing. CTABL Heart: RRR no MRG Abd: NABS, Soft. Nondistended, Nontender Exts: Brisk capillary refill, warm and  well perfused.  Skin: Urticaria present bilateral forearms Patient declines pelvic exam.  No results found for this or any previous visit (from the past 24 hour(s)). No results found.  Assessment and Plan: 29 y.o. female with urticaria. Unclear etiology. Treat with prednisone dose pack, and triamcinolone cream. Recommend over-the-counter antihistamines.  Prescribe 1 tablet of fluconazole  for yeast infection.  Discussed warning signs or symptoms. Please see discharge instructions. Patient expresses understanding.     Gregor Hams, MD 04/10/14 1918  Gregor Hams, MD 04/10/14 0175  Gregor Hams, MD 04/10/14 [phone]

## 2014-05-02 ENCOUNTER — Ambulatory Visit: Payer: Medicaid Other | Admitting: Internal Medicine

## 2014-05-19 ENCOUNTER — Ambulatory Visit (INDEPENDENT_AMBULATORY_CARE_PROVIDER_SITE_OTHER): Payer: 59 | Admitting: Internal Medicine

## 2014-05-19 ENCOUNTER — Encounter: Payer: Self-pay | Admitting: Internal Medicine

## 2014-05-19 VITALS — BP 116/76 | HR 83 | Temp 98.3°F | Resp 16 | Ht 63.0 in | Wt 165.4 lb

## 2014-05-19 DIAGNOSIS — Z8632 Personal history of gestational diabetes: Secondary | ICD-10-CM

## 2014-05-19 DIAGNOSIS — E663 Overweight: Secondary | ICD-10-CM

## 2014-05-19 DIAGNOSIS — Z0000 Encounter for general adult medical examination without abnormal findings: Secondary | ICD-10-CM

## 2014-05-19 NOTE — Progress Notes (Addendum)
   Subjective:    Patient ID: Lauren [name], female    DOB: 03/18/1985, 29 y.o.   MRN: [id number]  HPI The patient is a 29 YO female who is coming in to talk about her risk of diabetes. She did have gestational diabetes during 2 of her 3 pregnancies and managed with medications. She also had hypertension in her last pregnancy and remembers being told she is at higher risk of getting those things in the future. She does not have increased thirst or urination. She is not exercising right now. She is hoping to go back to school soon to change jobs. Denies any complaints including chest pain, nausea, SOB, joint pain or swelling. She does get occasional headaches.   [facility], social history reviewed and updated during today's visit.   Review of Systems  Constitutional: Negative for fever, activity change, appetite change, fatigue and unexpected weight change.  HENT: Negative.   Eyes: Negative.   Respiratory: Negative for cough, chest tightness, shortness of breath and wheezing.   Cardiovascular: Negative for chest pain, palpitations and leg swelling.  Gastrointestinal: Negative for abdominal pain, diarrhea, constipation and abdominal distention.  Musculoskeletal: Negative.   Skin: Negative.   Neurological: Negative.   Psychiatric/Behavioral: Negative.       Objective:   Physical Exam  Constitutional: She is oriented to person, place, and time. She appears well-developed and well-nourished.  HENT:  Head: Normocephalic and atraumatic.  Eyes: EOM are normal.  Neck: Normal range of motion.  Cardiovascular: Normal rate and regular rhythm.   Pulmonary/Chest: Effort normal and breath sounds normal.  Abdominal: Soft. Bowel sounds are normal. She exhibits no distension. There is no tenderness.  Musculoskeletal: She exhibits no edema.  Neurological: She is alert and oriented to person, place, and time. Coordination normal.  Skin: Skin is warm and dry.   Filed Vitals:   05/19/14 0956  BP:  116/76  Pulse: 83  Temp: 98.3 F (36.8 C)  TempSrc: Oral  Resp: 16  Height: 5\' 3"  (1.6 m)  Weight: 165 lb 6.4 oz (75.025 kg)  SpO2: 97%      Assessment & Plan:

## 2014-05-19 NOTE — Assessment & Plan Note (Signed)
Being overweight increases her risk of diabetes and since she is already high risk have talked with her about regular exercise and limiting sugary beverages and sweet treats. She will work on losing about 5-10 pounds before her visit next year and definitely avoid gaining weight.

## 2014-05-19 NOTE — Patient Instructions (Signed)
We will have you start taking some activia yogurt or some probiotics (you can ask the pharmacist to help you find them) to help the gut return to normal.   In the meantime you can use some imodium if you need to go someone and cannot be running to the bathroom. I would advise taking 1/2 or 1 before eating to see if it helps. The main side effect is constipation so if you are getting constipated then stop taking it as often or as much.   We will check on the sugars today and the other thing we will be watching is the blood pressures.   In order to reduce your risk of getting diabetes and high blood pressure keeping a healthy weight can do a lot to prevent these diseases. We recommend exercising about 3-4 times per week for 30 minutes per time to help with the weight.   Come back in about 1 year so we can check on you, if you have any problems or questions please feel free to call the office.   Health Maintenance Adopting a healthy lifestyle and getting preventive care can go a long way to promote health and wellness. Talk with your health care provider about what schedule of regular examinations is right for you. This is a good chance for you to check in with your provider about disease prevention and staying healthy. In between checkups, there are plenty of things you can do on your own. Experts have done a lot of research about which lifestyle changes and preventive measures are most likely to keep you healthy. Ask your health care provider for more information. WEIGHT AND DIET  Eat a healthy diet  Be sure to include plenty of vegetables, fruits, low-fat dairy products, and lean protein.  Do not eat a lot of foods high in solid fats, added sugars, or salt.  Get regular exercise. This is one of the most important things you can do for your health.  Most adults should exercise for at least 150 minutes each week. The exercise should increase your heart rate and make you sweat (moderate-intensity  exercise).  Most adults should also do strengthening exercises at least twice a week. This is in addition to the moderate-intensity exercise.  Maintain a healthy weight  Body mass index (BMI) is a measurement that can be used to identify possible weight problems. It estimates body fat based on height and weight. Your health care provider can help determine your BMI and help you achieve or maintain a healthy weight.  For females 50 years of age and older:   A BMI below 18.5 is considered underweight.  A BMI of 18.5 to 24.9 is normal.  A BMI of 25 to 29.9 is considered overweight.  A BMI of 30 and above is considered obese.  Watch levels of cholesterol and blood lipids  You should start having your blood tested for lipids and cholesterol at 28 years of age, then have this test every 5 years.  You may need to have your cholesterol levels checked more often if:  Your lipid or cholesterol levels are high.  You are older than 28 years of age.  You are at high risk for heart disease.  CANCER SCREENING   Lung Cancer  Lung cancer screening is recommended for adults 70-37 years old who are at high risk for lung cancer because of a history of smoking.  A yearly low-dose CT scan of the lungs is recommended for people who:  Currently  smoke.  Have quit within the past 15 years.  Have at least a 30-pack-year history of smoking. A pack year is smoking an average of one pack of cigarettes a day for 1 year.  Yearly screening should continue until it has been 15 years since you quit.  Yearly screening should stop if you develop a health problem that would prevent you from having lung cancer treatment.  Breast Cancer  Practice breast self-awareness. This means understanding how your breasts normally appear and feel.  It also means doing regular breast self-exams. Let your health care provider know about any changes, no matter how small.  If you are in your 20s or 30s, you should  have a clinical breast exam (CBE) by a health care provider every 1-3 years as part of a regular health exam.  If you are 52 or older, have a CBE every year. Also consider having a breast X-ray (mammogram) every year.  If you have a family history of breast cancer, talk to your health care provider about genetic screening.  If you are at high risk for breast cancer, talk to your health care provider about having an MRI and a mammogram every year.  Breast cancer gene (BRCA) assessment is recommended for women who have family members with BRCA-related cancers. BRCA-related cancers include:  Breast.  Ovarian.  Tubal.  Peritoneal cancers.  Results of the assessment will determine the need for genetic counseling and BRCA1 and BRCA2 testing. Cervical Cancer Routine pelvic examinations to screen for cervical cancer are no longer recommended for nonpregnant women who are considered low risk for cancer of the pelvic organs (ovaries, uterus, and vagina) and who do not have symptoms. A pelvic examination may be necessary if you have symptoms including those associated with pelvic infections. Ask your health care provider if a screening pelvic exam is right for you.   The Pap test is the screening test for cervical cancer for women who are considered at risk.  If you had a hysterectomy for a problem that was not cancer or a condition that could lead to cancer, then you no longer need Pap tests.  If you are older than 65 years, and you have had normal Pap tests for the past 10 years, you no longer need to have Pap tests.  If you have had past treatment for cervical cancer or a condition that could lead to cancer, you need Pap tests and screening for cancer for at least 20 years after your treatment.  If you no longer get a Pap test, assess your risk factors if they change (such as having a new sexual partner). This can affect whether you should start being screened again.  Some women have medical  problems that increase their chance of getting cervical cancer. If this is the case for you, your health care provider may recommend more frequent screening and Pap tests.  The human papillomavirus (HPV) test is another test that may be used for cervical cancer screening. The HPV test looks for the virus that can cause cell changes in the cervix. The cells collected during the Pap test can be tested for HPV.  The HPV test can be used to screen women 55 years of age and older. Getting tested for HPV can extend the interval between normal Pap tests from three to five years.  An HPV test also should be used to screen women of any age who have unclear Pap test results.  After 28 years of age, women should  have HPV testing as often as Pap tests.  Colorectal Cancer  This type of cancer can be detected and often prevented.  Routine colorectal cancer screening usually begins at 28 years of age and continues through 28 years of age.  Your health care provider may recommend screening at an earlier age if you have risk factors for colon cancer.  Your health care provider may also recommend using home test kits to check for hidden blood in the stool.  A small camera at the end of a tube can be used to examine your colon directly (sigmoidoscopy or colonoscopy). This is done to check for the earliest forms of colorectal cancer.  Routine screening usually begins at age 59.  Direct examination of the colon should be repeated every 5-10 years through 28 years of age. However, you may need to be screened more often if early forms of precancerous polyps or small growths are found. Skin Cancer  Check your skin from head to toe regularly.  Tell your health care provider about any new moles or changes in moles, especially if there is a change in a mole's shape or color.  Also tell your health care provider if you have a mole that is larger than the size of a pencil eraser.  Always use sunscreen. Apply  sunscreen liberally and repeatedly throughout the day.  Protect yourself by wearing long sleeves, pants, a wide-brimmed hat, and sunglasses whenever you are outside. HEART DISEASE, DIABETES, AND HIGH BLOOD PRESSURE   Have your blood pressure checked at least every 1-2 years. High blood pressure causes heart disease and increases the risk of stroke.  If you are between 86 years and 4 years old, ask your health care provider if you should take aspirin to prevent strokes.  Have regular diabetes screenings. This involves taking a blood sample to check your fasting blood sugar level.  If you are at a normal weight and have a low risk for diabetes, have this test once every three years after 28 years of age.  If you are overweight and have a high risk for diabetes, consider being tested at a younger age or more often. PREVENTING INFECTION  Hepatitis B  If you have a higher risk for hepatitis B, you should be screened for this virus. You are considered at high risk for hepatitis B if:  You were born in a country where hepatitis B is common. Ask your health care provider which countries are considered high risk.  Your parents were born in a high-risk country, and you have not been immunized against hepatitis B (hepatitis B vaccine).  You have HIV or AIDS.  You use needles to inject street drugs.  You live with someone who has hepatitis B.  You have had sex with someone who has hepatitis B.  You get hemodialysis treatment.  You take certain medicines for conditions, including cancer, organ transplantation, and autoimmune conditions. Hepatitis C  Blood testing is recommended for:  Everyone born from [birth year] through 1964.  Anyone with known risk factors for hepatitis C. Sexually transmitted infections (STIs)  You should be screened for sexually transmitted infections (STIs) including gonorrhea and chlamydia if:  You are sexually active and are younger than 28 years of age.  You are  older than 28 years of age and your health care provider tells you that you are at risk for this type of infection.  Your sexual activity has changed since you were last screened and you are at an increased risk  for chlamydia or gonorrhea. Ask your health care provider if you are at risk.  If you do not have HIV, but are at risk, it may be recommended that you take a prescription medicine daily to prevent HIV infection. This is called pre-exposure prophylaxis (PrEP). You are considered at risk if:  You are sexually active and do not regularly use condoms or know the HIV status of your partner(s).  You take drugs by injection.  You are sexually active with a partner who has HIV. Talk with your health care provider about whether you are at high risk of being infected with HIV. If you choose to begin PrEP, you should first be tested for HIV. You should then be tested every 3 months for as long as you are taking PrEP.  PREGNANCY   If you are premenopausal and you may become pregnant, ask your health care provider about preconception counseling.  If you may become pregnant, take 400 to 800 micrograms (mcg) of folic acid every day.  If you want to prevent pregnancy, talk to your health care provider about birth control (contraception). OSTEOPOROSIS AND MENOPAUSE   Osteoporosis is a disease in which the bones lose minerals and strength with aging. This can result in serious bone fractures. Your risk for osteoporosis can be identified using a bone density scan.  If you are 45 years of age or older, or if you are at risk for osteoporosis and fractures, ask your health care provider if you should be screened.  Ask your health care provider whether you should take a calcium or vitamin D supplement to lower your risk for osteoporosis.  Menopause may have certain physical symptoms and risks.  Hormone replacement therapy may reduce some of these symptoms and risks. Talk to your health care provider  about whether hormone replacement therapy is right for you.  HOME CARE INSTRUCTIONS   Schedule regular health, dental, and eye exams.  Stay current with your immunizations.   Do not use any tobacco products including cigarettes, chewing tobacco, or electronic cigarettes.  If you are pregnant, do not drink alcohol.  If you are breastfeeding, limit how much and how often you drink alcohol.  Limit alcohol intake to no more than 1 drink per day for nonpregnant women. One drink equals 12 ounces of beer, 5 ounces of wine, or 1 ounces of hard liquor.  Do not use street drugs.  Do not share needles.  Ask your health care provider for help if you need support or information about quitting drugs.  Tell your health care provider if you often feel depressed.  Tell your health care provider if you have ever been abused or do not feel safe at home. Document Released: 08/16/2010 Document Revised: 06/17/2013 Document Reviewed: 01/02/2013 [facility] Patient Information 2015 [place], [place]. This information is not intended to replace advice given to you by your health care provider. Make sure you discuss any questions you have with your health care provider.

## 2014-05-19 NOTE — Assessment & Plan Note (Signed)
Will need close monitoring of her blood pressure. Normal today and reinforced the importance of maintaining a healthy weight and exercising to decrease her risk of getting high blood pressure in the future.

## 2014-05-19 NOTE — Progress Notes (Signed)
Pre visit review using our clinic review tool, if applicable. No additional management support is needed unless otherwise documented below in the visit note. 

## 2014-05-19 NOTE — Assessment & Plan Note (Signed)
Checking HgA1c today and talked to her about the importance of maintaining a healthy weight and exercising regularly. She will need monitoring every year for her sugars. No plans of another pregnancy at this time has IUD.

## 2014-08-11 ENCOUNTER — Other Ambulatory Visit: Payer: Self-pay

## 2014-08-15 ENCOUNTER — Encounter: Payer: Self-pay | Admitting: Certified Nurse Midwife

## 2014-08-15 ENCOUNTER — Ambulatory Visit (INDEPENDENT_AMBULATORY_CARE_PROVIDER_SITE_OTHER): Payer: 59 | Admitting: Certified Nurse Midwife

## 2014-08-15 VITALS — BP 123/87 | HR 78 | Temp 98.2°F | Wt 160.0 lb

## 2014-08-15 DIAGNOSIS — A499 Bacterial infection, unspecified: Secondary | ICD-10-CM

## 2014-08-15 DIAGNOSIS — N760 Acute vaginitis: Secondary | ICD-10-CM | POA: Diagnosis not present

## 2014-08-15 DIAGNOSIS — B9689 Other specified bacterial agents as the cause of diseases classified elsewhere: Secondary | ICD-10-CM

## 2014-08-15 MED ORDER — METRONIDAZOLE 500 MG PO TABS: 500.0000 mg | ORAL_TABLET | Freq: Two times a day (BID) | ORAL | Status: DC

## 2014-08-15 NOTE — Progress Notes (Signed)
Patient ID: Lauren [name], female   DOB: 11/12/1985, 29 y.o.   MRN: [id number]   Chief Complaint  Patient presents with  . Follow-up    mirena issues    HPI Lauren GristChristine A Elman is a 29 y.o. female.  C/O severe pain with sexual intercourse and both her and her partner feel sharp stabbing pains, she thinks is from the IUD.   IUD was placed 08/2013.   HPI  Past Medical History  Diagnosis Date  . Pregnancy induced hypertension     with pregnancy in 2005  . Headache(784.0)   . Sickle cell trait   . Urinary tract infection   . SROM (spontaneous rupture of membranes) 05/11/2011  . Allergic disorder   . Bartholin cyst   . Irregular menstrual cycle   . Gestational diabetes     2015    Past Surgical History  Procedure Laterality Date  . Therapeutic abortion      Family History  Problem Relation Age of Onset  . Alcohol abuse Neg Hx   . Arthritis Neg Hx   . Birth defects Neg Hx   . COPD Neg Hx   . Depression Neg Hx   . Drug abuse Neg Hx   . Early death Neg Hx   . Hearing loss Neg Hx   . Heart disease Neg Hx   . Hypertension Neg Hx   . Kidney disease Neg Hx   . Learning disabilities Neg Hx   . Mental illness Neg Hx   . Mental retardation Neg Hx   . Miscarriages / Stillbirths Neg Hx   . Stroke Neg Hx   . Vision loss Neg Hx   . Asthma Father   . Diabetes Sister   . Hyperlipidemia Sister   . Hyperlipidemia Sister   . Hyperlipidemia Sister   . Hyperlipidemia Sister   . Diabetes Sister   . Heart failure Father   . Heart failure Father   . Heart failure Father   . Heart failure Father   . Diabetes Sister   . Heart failure Father   . Heart failure Father   . Cancer Sister     Social History History  Substance Use Topics  . Smoking status: Never Smoker   . Smokeless tobacco: Never Used  . Alcohol Use: No    No Known Allergies  Current Outpatient Prescriptions  Medication Sig Dispense Refill  . ibuprofen (ADVIL,MOTRIN) 600 MG tablet Take 1 tablet (600 mg total)  by mouth every 6 (six) hours. 30 tablet 5  . metroNIDAZOLE (FLAGYL) 500 MG tablet Take 1 tablet (500 mg total) by mouth 2 (two) times daily. 14 tablet 0   No current facility-administered medications for this visit.    Review of Systems Review of Systems Constitutional: negative for fatigue and weight loss Respiratory: negative for cough and wheezing Cardiovascular: negative for chest pain, fatigue and palpitations Gastrointestinal: negative for abdominal pain and change in bowel habits Genitourinary:negative Integument/breast: negative for nipple discharge Musculoskeletal:negative for myalgias Neurological: negative for gait problems and tremors Behavioral/Psych: negative for abusive relationship, depression Endocrine: negative for temperature intolerance     Blood pressure 123/87, pulse 78, temperature 98.2 F (36.8 C), weight 160 lb (72.576 kg), not currently breastfeeding.  Physical Exam Physical Exam General:   alert  Skin:   no rash or abnormalities  Lungs:   clear to auscultation bilaterally  Heart:   regular rate and rhythm, S1, S2 normal, no murmur, click, rub or gallop  Breasts:  deferred  Abdomen:  normal findings: no organomegaly, soft, non-tender and no hernia obese  Pelvis:  External genitalia: normal general appearance Urinary system: urethral meatus normal and bladder without fullness, nontender Vaginal: normal without tenderness, induration or masses Cervix: normal appearance, no cmt, strings present Adnexa: normal bimanual exam Uterus: anteverted and non-tender, normal size    50% of 30 min visit spent on counseling and coordination of care.   Data Reviewed Previous medical hx, labs, meds  Assessment     IUD in place BV     Plan    Orders Placed This Encounter  Procedures  . SureSwab, Vaginosis/Vaginitis Plus   Meds ordered this encounter  Medications  . metroNIDAZOLE (FLAGYL) 500 MG tablet    Sig: Take 1 tablet (500 mg total) by mouth 2  (two) times daily.    Dispense:  14 tablet    Refill:  0    Possible management options include: Mirena removal and nexplanon insertion.   Follow up as needed.

## 2014-08-20 ENCOUNTER — Telehealth: Payer: Self-pay | Admitting: *Deleted

## 2014-08-20 NOTE — Telephone Encounter (Signed)
Patient is interested in having her Mirena IUD removed and a Nexplanon inserted. Patient has been scheduled for 08-21-14.

## 2014-08-21 ENCOUNTER — Other Ambulatory Visit: Payer: Self-pay | Admitting: Certified Nurse Midwife

## 2014-08-21 ENCOUNTER — Ambulatory Visit: Payer: Self-pay | Admitting: Certified Nurse Midwife

## 2014-08-21 LAB — SURESWAB, VAGINOSIS/VAGINITIS PLUS
ATOPOBIUM VAGINAE: 6.5 Log (cells/mL)
C. PARAPSILOSIS, DNA: NOT DETECTED
C. albicans, DNA: NOT DETECTED
C. glabrata, DNA: NOT DETECTED
C. trachomatis RNA, TMA: NOT DETECTED
C. tropicalis, DNA: NOT DETECTED
GARDNERELLA VAGINALIS: 7.9 Log (cells/mL)
LACTOBACILLUS SPECIES: NOT DETECTED Log (cells/mL)
MEGASPHAERA SPECIES: 7.7 Log (cells/mL)
N. gonorrhoeae RNA, TMA: NOT DETECTED
T. vaginalis RNA, QL TMA: NOT DETECTED

## 2014-09-03 ENCOUNTER — Telehealth: Payer: Self-pay | Admitting: *Deleted

## 2014-09-03 NOTE — Telephone Encounter (Signed)
Patient is interested in rescheduling an appointment for an IUD removal and a Nexplanon Insertion.  Attempted to contact the patient and unable to leave a message.

## 2014-09-26 ENCOUNTER — Ambulatory Visit (INDEPENDENT_AMBULATORY_CARE_PROVIDER_SITE_OTHER): Payer: 59 | Admitting: Family

## 2014-09-26 ENCOUNTER — Encounter: Payer: Self-pay | Admitting: Family

## 2014-09-26 VITALS — BP 102/80 | HR 81 | Temp 98.4°F | Resp 18 | Ht 63.0 in | Wt 163.0 lb

## 2014-09-26 DIAGNOSIS — G43909 Migraine, unspecified, not intractable, without status migrainosus: Secondary | ICD-10-CM | POA: Insufficient documentation

## 2014-09-26 DIAGNOSIS — G43809 Other migraine, not intractable, without status migrainosus: Secondary | ICD-10-CM | POA: Diagnosis not present

## 2014-09-26 MED ORDER — IBUPROFEN 800 MG PO TABS: 800.0000 mg | ORAL_TABLET | Freq: Three times a day (TID) | ORAL | Status: DC | PRN

## 2014-09-26 MED ORDER — SUMATRIPTAN SUCCINATE 50 MG PO TABS: ORAL_TABLET | ORAL | Status: DC

## 2014-09-26 MED ORDER — METHOCARBAMOL 500 MG PO TABS: 500.0000 mg | ORAL_TABLET | Freq: Three times a day (TID) | ORAL | Status: DC | PRN

## 2014-09-26 NOTE — Assessment & Plan Note (Signed)
Symptoms and exam consistent with migraine headache. Neurological exam benign. Start sumatriptan. Start methocarbamol as needed for neck muscle spasm. Start ibuprofen as needed for pain. Declines in office injection of ketorolac. Instructed to seek emergency care if symptoms are worse headache of her life developed. Follow-up after trial of Imitrex or sooner if needed.

## 2014-09-26 NOTE — Progress Notes (Signed)
Pre visit review using our clinic review tool, if applicable. No additional management support is needed unless otherwise documented below in the visit note. 

## 2014-09-26 NOTE — Progress Notes (Signed)
   Subjective:    Patient ID: Lauren [name], female    DOB: [birth year], 29 y.o.   MRN: [id number]  Chief Complaint  Patient presents with  . Migraine    x5 days, has tried taking numerous OTC medications that have not helped much., when bending down she can feel a throbbing in her head as well as when she lays down and turns a certain way    HPI:  Lauren [name] is a 29 y.o. female with a PMH of diabetes, hypertension, headaches, sickle cell trait, and irregular menstrual cycle who presents today for an acute office visit.   1.) Headache - Associated symptom of a headache has been gong on for about 5 days. Described as constant throbbing with sensitivity to light and sound with associated nausea and no vomiting.  Severity is rated an 8/10. Modifying factors include ibuprofen, advil, and excedrine migraine which have helped minimal. Has never been maintained on any medications. Frequency is a couple of times per month. Denies any known triggers.    No Known Allergies  No current outpatient prescriptions on file prior to visit.   No current facility-administered medications on file prior to visit.    Review of Systems  Constitutional: Negative for fever and chills.  Eyes: Positive for photophobia.  Gastrointestinal: Positive for nausea. Negative for vomiting.  Musculoskeletal: Positive for neck stiffness.  Neurological: Positive for headaches.      Objective:    BP 102/80 mmHg  Pulse 81  Temp(Src) 98.4 F (36.9 C) (Oral)  Resp 18  Ht  (1.6 m)  Wt 163 lb (73.936 kg)  BMI 28.88 kg/m2  SpO2 99% Nursing note and vital signs reviewed.  Physical Exam  Constitutional: She is oriented to person, place, and time. She appears well-developed and well-nourished. No distress.  Cardiovascular: Normal rate, regular rhythm, normal heart sounds and intact distal pulses.   Pulmonary/Chest: Effort normal and breath sounds normal.  Neurological: She is alert and oriented to  person, place, and time. She has normal reflexes. She displays normal reflexes. No cranial nerve deficit. She exhibits normal muscle tone. Coordination normal.  Skin: Skin is warm and dry.  Psychiatric: She has a normal mood and affect. Her behavior is normal. Judgment and thought content normal.       Assessment & Plan:   Problem List Items Addressed This Visit      Cardiovascular and Mediastinum   Migraine - Primary    Symptoms and exam consistent with migraine headache. Neurological exam benign. Start sumatriptan. Start methocarbamol as needed for neck muscle spasm. Start ibuprofen as needed for pain. Declines in office injection of ketorolac. Instructed to seek emergency care if symptoms are worse headache of her life developed. Follow-up after trial of Imitrex or sooner if needed.      Relevant Medications   SUMAtriptan (IMITREX) 50 MG tablet   ibuprofen (ADVIL,MOTRIN) 800 MG tablet   methocarbamol (ROBAXIN) 500 MG tablet

## 2014-09-26 NOTE — Patient Instructions (Signed)
Thank you for choosing [facility].  Summary/Instructions:  Your prescription(s) have been submitted to your pharmacy or been printed and provided for you. Please take as directed and contact our office if you believe you are having problem(s) with the medication(s) or have any questions.  If your symptoms worsen or fail to improve, please contact our office for further instruction, or in case of emergency go directly to the emergency room at the closest medical facility.   Migraine Headache A migraine headache is an intense, throbbing pain on one or both sides of your head. A migraine can last for 30 minutes to several hours. CAUSES  The exact cause of a migraine headache is not always known. However, a migraine may be caused when nerves in the brain become irritated and release chemicals that cause inflammation. This causes pain. Certain things may also trigger migraines, such as:  Alcohol.  Smoking.  Stress.  Menstruation.  Aged cheeses.  Foods or drinks that contain nitrates, glutamate, aspartame, or tyramine.  Lack of sleep.  Chocolate.  Caffeine.  Hunger.  Physical exertion.  Fatigue.  Medicines used to treat chest pain (nitroglycerine), birth control pills, estrogen, and some blood pressure medicines. SIGNS AND SYMPTOMS  Pain on one or both sides of your head.  Pulsating or throbbing pain.  Severe pain that prevents daily activities.  Pain that is aggravated by any physical activity.  Nausea, vomiting, or both.  Dizziness.  Pain with exposure to bright lights, loud noises, or activity.  General sensitivity to bright lights, loud noises, or smells. Before you get a migraine, you may get warning signs that a migraine is coming (aura). An aura may include:  Seeing flashing lights.  Seeing bright spots, halos, or zigzag lines.  Having tunnel vision or blurred vision.  Having feelings of numbness or tingling.  Having trouble  talking.  Having muscle weakness. DIAGNOSIS  A migraine headache is often diagnosed based on:  Symptoms.  Physical exam.  A CT scan or MRI of your head. These imaging tests cannot diagnose migraines, but they can help rule out other causes of headaches. TREATMENT Medicines may be given for pain and nausea. Medicines can also be given to help prevent recurrent migraines.  HOME CARE INSTRUCTIONS  Only take over-the-counter or prescription medicines for pain or discomfort as directed by your health care provider. The use of long-term narcotics is not recommended.  Lie down in a dark, quiet room when you have a migraine.  Keep a journal to find out what may trigger your migraine headaches. For example, write down:  What you eat and drink.  How much sleep you get.  Any change to your diet or medicines.  Limit alcohol consumption.  Quit smoking if you smoke.  Get 7-9 hours of sleep, or as recommended by your health care provider.  Limit stress.  Keep lights dim if bright lights bother you and make your migraines worse. SEEK IMMEDIATE MEDICAL CARE IF:   Your migraine becomes severe.  You have a fever.  You have a stiff neck.  You have vision loss.  You have muscular weakness or loss of muscle control.  You start losing your balance or have trouble walking.  You feel faint or pass out.  You have severe symptoms that are different from your first symptoms. MAKE SURE YOU:   Understand these instructions.  Will watch your condition.  Will get help right away if you are not doing well or get worse. Document Released: 01/31/2005 Document   Revised: 06/17/2013 Document Reviewed: 10/08/2012 Exit[employer] Patient Information 2015 [employer]. This information is not intended to replace advice given to you by your health care provider. Make sure you discuss any questions you have with your health care provider.  

## 2014-09-30 ENCOUNTER — Telehealth: Payer: Self-pay | Admitting: Internal Medicine

## 2014-09-30 DIAGNOSIS — G43809 Other migraine, not intractable, without status migrainosus: Secondary | ICD-10-CM

## 2014-09-30 MED ORDER — IBUPROFEN 800 MG PO TABS: 800.0000 mg | ORAL_TABLET | Freq: Three times a day (TID) | ORAL | Status: DC | PRN

## 2014-09-30 MED ORDER — METHOCARBAMOL 500 MG PO TABS: 500.0000 mg | ORAL_TABLET | Freq: Three times a day (TID) | ORAL | Status: DC | PRN

## 2014-09-30 MED ORDER — SUMATRIPTAN SUCCINATE 50 MG PO TABS: ORAL_TABLET | ORAL | Status: DC

## 2014-09-30 NOTE — Telephone Encounter (Signed)
All medications resent.

## 2014-09-30 NOTE — Telephone Encounter (Signed)
pts prescriptions that were sent to the pharmacy on Fri did not go through. Can you please resend them.

## 2014-11-21 ENCOUNTER — Ambulatory Visit: Payer: 59 | Admitting: Internal Medicine

## 2014-11-24 NOTE — Telephone Encounter (Signed)
Attempted to contact the patient and left message for patient to call the office.  

## 2015-02-25 ENCOUNTER — Other Ambulatory Visit: Payer: Self-pay | Admitting: Family Medicine

## 2015-05-08 NOTE — Telephone Encounter (Signed)
Patient has not returned call to the office. Call to be re-filed. 

## 2015-05-14 ENCOUNTER — Telehealth: Payer: Self-pay | Admitting: *Deleted

## 2015-05-14 NOTE — Telephone Encounter (Signed)
I called pt to see if she has received the 2016-17 Flu vaccine. No answer. Left mess for patient to call back to see if she wants a flu vaccine.

## 2015-07-14 ENCOUNTER — Encounter: Payer: Self-pay | Admitting: Certified Nurse Midwife

## 2015-07-14 ENCOUNTER — Ambulatory Visit: Payer: 59 | Admitting: Certified Nurse Midwife

## 2015-07-14 VITALS — BP 129/90 | HR 84 | Temp 98.2°F | Ht 62.0 in | Wt 168.0 lb

## 2015-07-15 NOTE — Progress Notes (Signed)
Did not stay for exam.  

## 2015-07-29 ENCOUNTER — Encounter: Payer: Self-pay | Admitting: Certified Nurse Midwife

## 2015-07-29 ENCOUNTER — Ambulatory Visit (INDEPENDENT_AMBULATORY_CARE_PROVIDER_SITE_OTHER): Payer: 59 | Admitting: Certified Nurse Midwife

## 2015-07-29 VITALS — BP 129/90 | HR 89 | Ht 63.0 in | Wt 165.0 lb

## 2015-07-29 DIAGNOSIS — T8389XD Other specified complication of genitourinary prosthetic devices, implants and grafts, subsequent encounter: Secondary | ICD-10-CM

## 2015-07-29 DIAGNOSIS — Z01419 Encounter for gynecological examination (general) (routine) without abnormal findings: Secondary | ICD-10-CM | POA: Diagnosis not present

## 2015-07-29 DIAGNOSIS — Z113 Encounter for screening for infections with a predominantly sexual mode of transmission: Secondary | ICD-10-CM

## 2015-07-29 DIAGNOSIS — E669 Obesity, unspecified: Secondary | ICD-10-CM

## 2015-07-29 NOTE — Progress Notes (Signed)
Patient ID: Lauren [name], female   DOB: 03/06/1985, 30 y.o.   MRN: [id number]    Subjective:      Lauren [name] is a 30 y.o. female here for a routine exam.  Current complaints: IUD issues with pain with sexual intercourse.  Desires to have IUD removed.  Is not having periods.  Discussed Kyleena as a possibility.    Desires full STD screening exam.    Personal health questionnaire:  Is patient Ashkenazi Jewish, have a family history of breast and/or ovarian cancer: no Is there a family history of uterine cancer diagnosed at age < 5450, gastrointestinal cancer, urinary tract cancer, family member who is a Personnel officerLynch syndrome-associated carrier: no Is the patient overweight and hypertensive, family history of diabetes, personal history of gestational diabetes, preeclampsia or PCOS: yes Is patient over 7355, have PCOS,  family history of premature CHD under age 29, diabetes, smoke, have hypertension or peripheral artery disease:  no At any time, has a partner hit, kicked or otherwise hurt or frightened you?: no Over the past 2 weeks, have you felt down, depressed or hopeless?: no Over the past 2 weeks, have you felt little interest or pleasure in doing things?:no   Gynecologic History No LMP recorded. Patient is not currently having periods (Reason: IUD). Contraception: IUD Last Pap: 2014. Results were: normal Last mammogram: n/a.   Obstetric History OB History  Gravida Para Term Preterm AB SAB TAB Ectopic Multiple Living  4 3 2 1 1  0 1 0 0 3    # Outcome Date GA Lbr Len/2nd Weight Sex Delivery Anes PTL Lv  4 Term 07/15/13 2244w1d 01:45 / 00:37 7 lb 9.7 oz (3.45 kg) M Vag-Spont EPI  Y  3 Term 05/11/11 3168w0d 11:10 / 00:22 7 lb 6.9 oz (3.37 kg) M Vag-Spont EPI  Y  2 Preterm 06/02/03 8836w5d   F Vag-Spont EPI  Y     Comments: induced for high blood pressure; borderline diabetic  1 TAB               Past Medical History  Diagnosis Date  . Pregnancy induced hypertension     with pregnancy  in 2005  . Headache(784.0)   . Sickle cell trait (HCC)   . Urinary tract infection   . SROM (spontaneous rupture of membranes) 05/11/2011  . Allergic disorder   . Bartholin cyst   . Irregular menstrual cycle   . Gestational diabetes     2015    Past Surgical History  Procedure Laterality Date  . Therapeutic abortion    . Barthilon cyst       Current outpatient prescriptions:  .  levonorgestrel (MIRENA) 20 MCG/24HR IUD, 1 each by Intrauterine route once., Disp: , Rfl:  .  ibuprofen (ADVIL,MOTRIN) 800 MG tablet, Take 1 tablet (800 mg total) by mouth every 8 (eight) hours as needed. (Patient not taking: Reported on 07/29/2015), Disp: 30 tablet, Rfl: 0 No Known Allergies  Social History  Substance Use Topics  . Smoking status: Never Smoker   . Smokeless tobacco: Never Used  . Alcohol Use: 0.0 oz/week    0 Standard drinks or equivalent per week     Comment: occasional    Family History  Problem Relation Age of Onset  . Alcohol abuse Neg Hx   . Arthritis Neg Hx   . Birth defects Neg Hx   . COPD Neg Hx   . Depression Neg Hx   . Drug abuse Neg Hx   .  Early death Neg Hx   . Hearing loss Neg Hx   . Heart disease Neg Hx   . Hypertension Neg Hx   . Kidney disease Neg Hx   . Learning disabilities Neg Hx   . Mental illness Neg Hx   . Mental retardation Neg Hx   . Miscarriages / Stillbirths Neg Hx   . Stroke Neg Hx   . Vision loss Neg Hx   . Asthma Father   . Diabetes Sister   . Hyperlipidemia Sister   . Hyperlipidemia Sister   . Hyperlipidemia Sister   . Hyperlipidemia Sister   . Diabetes Sister   . Diabetes Sister   . Heart failure Father   . Cancer Sister   . Lupus Maternal Grandmother       Review of Systems  Constitutional: negative for fatigue and weight loss Respiratory: negative for cough and wheezing Cardiovascular: negative for chest pain, fatigue and palpitations Gastrointestinal: negative for abdominal pain and change in bowel  habits Musculoskeletal:negative for myalgias Neurological: negative for gait problems and tremors Behavioral/Psych: negative for abusive relationship, depression Endocrine: negative for temperature intolerance   Genitourinary:negative for abnormal menstrual periods, genital lesions, hot flashes, sexual problems and vaginal discharge Integument/breast: negative for breast lump, breast tenderness, nipple discharge and skin lesion(s)    Objective:       BP 129/90 mmHg  Pulse 89  Ht  (1.6 m)  Wt 165 lb (74.844 kg)  BMI 29.24 kg/m2 General:   alert  Skin:   no rash or abnormalities  Lungs:   clear to auscultation bilaterally  Heart:   regular rate and rhythm, S1, S2 normal, no murmur, click, rub or gallop  Breasts:   normal without suspicious masses, skin or nipple changes or axillary nodes  Abdomen:  normal findings: no organomegaly, soft, non-tender and no hernia  Pelvis:  External genitalia: normal general appearance Urinary system: urethral meatus normal and bladder without fullness, nontender Vaginal: normal without tenderness, induration or masses Cervix: normal appearance, +IUD strings trimmed Adnexa: normal bimanual exam Uterus: anteverted and non-tender, normal size   Procedure:  IUD strings trimmed a few cm, about 3 cm length at OS.   Lab Review Urine pregnancy test Labs reviewed yes Radiologic studies reviewed no  50% of 30 min visit spent on counseling and coordination of care.   Assessment:    Healthy female exam.    IUD strings trimmed.    STD screening exam  Obese  H/O GDM  Plan:    Education reviewed: calcium supplements, depression evaluation, low fat, low cholesterol diet, safe sex/STD prevention, self breast exams, skin cancer screening and weight bearing exercise. Contraception: IUD. Follow up in: when patient is ready to change from Mirena IUD to Rembrandt IUD or in 1 year for annual exam.   No orders of the defined types were placed in this  encounter.   No orders of the defined types were placed in this encounter.    Possible management options include: Mirena IUD removal and Kyleena IUD.  [place] for IUD position.   Follow up as needed.

## 2015-07-30 ENCOUNTER — Encounter: Payer: Self-pay | Admitting: *Deleted

## 2015-07-30 ENCOUNTER — Other Ambulatory Visit: Payer: Self-pay | Admitting: Certified Nurse Midwife

## 2015-07-30 LAB — PAP IG W/ RFLX HPV ASCU: PAP Smear Comment: 0

## 2015-07-30 LAB — HEPATITIS C ANTIBODY: Hep C Virus Ab: <0.1 s/co ratio (ref 0.0–0.9)

## 2015-07-30 LAB — HIV ANTIBODY (ROUTINE TESTING W REFLEX): HIV SCREEN 4TH GENERATION: NONREACTIVE

## 2015-07-30 LAB — HEMOGLOBIN A1C
ESTIMATED AVERAGE GLUCOSE: 131 mg/dL
Hgb A1c MFr Bld: 6.2 % — ABNORMAL HIGH (ref 4.8–5.6)

## 2015-07-30 LAB — HEPATITIS B SURFACE ANTIGEN: HEP B S AG: NEGATIVE

## 2015-07-30 LAB — RPR: RPR: NONREACTIVE

## 2015-08-02 LAB — NUSWAB VG+, CANDIDA 6SP
Atopobium vaginae: HIGH Score — AB
BVAB 2: HIGH Score — AB
CANDIDA ALBICANS, NAA: NEGATIVE
CANDIDA GLABRATA, NAA: NEGATIVE
CANDIDA LUSITANIAE, NAA: NEGATIVE
Candida krusei, NAA: NEGATIVE
Candida parapsilosis, NAA: NEGATIVE
Candida tropicalis, NAA: NEGATIVE
Chlamydia trachomatis, NAA: NEGATIVE
Megasphaera 1: HIGH Score — AB
NEISSERIA GONORRHOEAE, NAA: NEGATIVE
TRICH VAG BY NAA: NEGATIVE

## 2015-08-03 ENCOUNTER — Other Ambulatory Visit: Payer: Self-pay | Admitting: Certified Nurse Midwife

## 2015-08-03 DIAGNOSIS — N760 Acute vaginitis: Principal | ICD-10-CM

## 2015-08-03 DIAGNOSIS — B9689 Other specified bacterial agents as the cause of diseases classified elsewhere: Secondary | ICD-10-CM

## 2015-08-03 MED ORDER — METRONIDAZOLE 500 MG PO TABS: 500.0000 mg | ORAL_TABLET | Freq: Two times a day (BID) | ORAL | Status: DC

## 2015-08-05 ENCOUNTER — Encounter: Payer: Self-pay | Admitting: *Deleted

## 2015-09-02 ENCOUNTER — Ambulatory Visit (INDEPENDENT_AMBULATORY_CARE_PROVIDER_SITE_OTHER): Payer: 59 | Admitting: Certified Nurse Midwife

## 2015-09-02 VITALS — BP 131/87 | HR 85 | Temp 98.6°F | Wt 163.0 lb

## 2015-09-02 DIAGNOSIS — Z30017 Encounter for initial prescription of implantable subdermal contraceptive: Secondary | ICD-10-CM

## 2015-09-02 DIAGNOSIS — Z30432 Encounter for removal of intrauterine contraceptive device: Secondary | ICD-10-CM

## 2015-09-02 DIAGNOSIS — Z3049 Encounter for surveillance of other contraceptives: Secondary | ICD-10-CM | POA: Diagnosis not present

## 2015-09-03 DIAGNOSIS — Z30432 Encounter for removal of intrauterine contraceptive device: Secondary | ICD-10-CM | POA: Insufficient documentation

## 2015-09-03 DIAGNOSIS — Z30017 Encounter for initial prescription of implantable subdermal contraceptive: Secondary | ICD-10-CM | POA: Insufficient documentation

## 2015-09-03 NOTE — Progress Notes (Signed)
Patient ID: Lauren [name], female   DOB: 12/30/1985, 30 y.o.   MRN: [id number]  [facility]   Reasons  for removal:  Is having severe lower abdominal pain, cramping for 2 weeks.  Is not having periods.     A timeout was performed confirming the patient, the procedure and allergy status. The patient was placed in the lithotomy position, a speculum was placed.  Cervix and strings visualized.   Long forceps used in a strile manner.  Stings grasped with long forceps and device removed intact.  The patient tolerated the procedure well.  New contraceptive method: Nexplanon.    Nexplanon Procedure Note   PRE-OP DIAGNOSIS: desired long-term, reversible contraception  POST-OP DIAGNOSIS: Same  PROCEDURE: Nexplanon  placement Performing Provider: Orvilla Cornwallachelle Susano Cleckler CNM   Patient education prior to procedure, explained risk, benefits of Nexplanon, reviewed alternative options. Patient reported understanding. Gave consent to continue with procedure.   PROCEDURE:  Pregnancy Text :  Negative Site (check):      left arm         Sterile Preparation:   Betadinex3 Lot # [id number] Expiration Date  09/2017  Insertion site was selected 8 - 10 cm from medial epicondyle and marked along with guiding site using sterile marker. Procedure area was prepped and draped in a sterile fashion. 1% Lidocaine 1.5 ml given prior to procedure. Nexplanon  was inserted subcutaneously.Needle was removed from the insertion site. Nexplanon capsule was palpated by provider and patient to assure satisfactory placement. And a bandage applied and the arm was wrapped with gauze bandage.     Followup: The patient tolerated the procedure well without complications.  Instructions:  The patient was instructed to remove the dressing in 24 hours and that some bruising is to be expected.  She was advised to use over the counter analgesics as needed for any pain at the site.  She is to keep the area dry for 24 hours and to call  if her hand or arm becomes cold, numb, or blue.   Orvilla Cornwallachelle July Linam CNM

## 2015-09-10 ENCOUNTER — Ambulatory Visit (INDEPENDENT_AMBULATORY_CARE_PROVIDER_SITE_OTHER): Payer: 59 | Admitting: Family Medicine

## 2015-09-10 ENCOUNTER — Encounter: Payer: Self-pay | Admitting: Family Medicine

## 2015-09-10 ENCOUNTER — Ambulatory Visit (INDEPENDENT_AMBULATORY_CARE_PROVIDER_SITE_OTHER)
Admission: RE | Admit: 2015-09-10 | Discharge: 2015-09-10 | Disposition: A | Discharge status: Home / Self Care | Payer: 59 | Source: Ambulatory Visit | Attending: Family Medicine | Admitting: Family Medicine

## 2015-09-10 VITALS — BP 126/80 | HR 88 | Temp 98.5°F | Ht 63.0 in | Wt 160.7 lb

## 2015-09-10 DIAGNOSIS — M25562 Pain in left knee: Secondary | ICD-10-CM

## 2015-09-10 DIAGNOSIS — M79642 Pain in left hand: Secondary | ICD-10-CM

## 2015-09-10 NOTE — Progress Notes (Signed)
Subjective:    Patient ID: Lauren [name], female    DOB: [birth year], 30 y.o.   MRN: [id number]  HPI  [name] is a 30 year old female who presents today for Left knee pain following a mechanical fall on her steps which were wet last night. The steps were recently painted which caused them to be slick and she fell on her knee yesterday.  Associated symptoms of pain with walking and bending her knee and edema.  Edema was noted last night and she applied ice and took Aleve which provided moderate benefit. Relieving factor is rest, aggravating factor is ambulation. She denies any twisting motion or hearing a "pop" when falling. Abrasion present on knee which she cleaned with soap and water, used hydrogen peroxide, and applied OTC antibiotic ointment. She denies any drainage from abrasion and states that she does not need a bandage on it at this time.  She reports being able to sleep through the night and ambulate slowly with the use of Aleve.   Review of Systems  Constitutional: Negative for chills, fatigue and fever.  Respiratory: Negative for cough, shortness of breath and wheezing.   Cardiovascular: Negative for chest pain, palpitations and leg swelling.  Gastrointestinal: Negative for abdominal pain, constipation, diarrhea, nausea and vomiting.  Musculoskeletal:       Left knee pain. Ambulates slowly due to pain  Skin:       Abrasion on left knee  Neurological: Negative for dizziness, weakness, light-headedness and headaches.   Past Medical History:  Diagnosis Date  . Allergic disorder   . Bartholin cyst   . Gestational diabetes    2015  . Headache(784.0)   . Irregular menstrual cycle   . Pregnancy induced hypertension    with pregnancy in 2005  . Sickle cell trait (HCC)   . SROM (spontaneous rupture of membranes) 05/11/2011  . Urinary tract infection      Social History   Social History  . Marital status: Single    Spouse name: N/A  . Number of children: N/A  . Years of  education: N/A   Occupational History  . Not on file.   Social History Main Topics  . Smoking status: Never Smoker  . Smokeless tobacco: Never Used  . Alcohol use 0.0 oz/week     Comment: occasional  . Drug use: No  . Sexual activity: Yes    Partners: Male    Birth control/ protection: Condom, IUD     Comment: mirena   Other Topics Concern  . Not on file   Social History Narrative  . No narrative on file    Past Surgical History:  Procedure Laterality Date  . barthilon cyst    . THERAPEUTIC ABORTION      Family History  Problem Relation Age of Onset  . Alcohol abuse Neg Hx   . Arthritis Neg Hx   . Birth defects Neg Hx   . COPD Neg Hx   . Depression Neg Hx   . Drug abuse Neg Hx   . Early death Neg Hx   . Hearing loss Neg Hx   . Heart disease Neg Hx   . Hypertension Neg Hx   . Kidney disease Neg Hx   . Learning disabilities Neg Hx   . Mental illness Neg Hx   . Mental retardation Neg Hx   . Miscarriages / Stillbirths Neg Hx   . Stroke Neg Hx   . Vision loss Neg Hx   .  Asthma Father   . Diabetes Sister   . Hyperlipidemia Sister   . Hyperlipidemia Sister   . Hyperlipidemia Sister   . Hyperlipidemia Sister   . Diabetes Sister   . Diabetes Sister   . Heart failure Father   . Cancer Sister   . Lupus Maternal Grandmother     No Known Allergies  Current Outpatient Prescriptions on File Prior to Visit  Medication Sig Dispense Refill  . ibuprofen (ADVIL,MOTRIN) 800 MG tablet Take 1 tablet (800 mg total) by mouth every 8 (eight) hours as needed. 30 tablet 0   No current facility-administered medications on file prior to visit.     BP 126/80 (BP Location: Right Leg, Patient Position: Sitting, Cuff Size: Normal)   Pulse 88   Temp 98.5 F (36.9 C) (Oral)   Ht 5\' 3"  (1.6 m)   Wt 160 lb 11.2 oz (72.9 kg)   SpO2 97%   BMI 28.47 kg/m       Objective:   Physical Exam  Constitutional: She is oriented to person, place, and time. She appears well-developed  and well-nourished.  Eyes: Pupils are equal, round, and reactive to light.  Cardiovascular: Normal rate, regular rhythm and intact distal pulses.   Pulmonary/Chest: Effort normal and breath sounds normal. She has no wheezes.  Musculoskeletal:       Left wrist: She exhibits normal range of motion, no tenderness, no swelling, no deformity and no laceration.       Left knee: She exhibits decreased range of motion. She exhibits no ecchymosis, no deformity, no erythema and normal alignment. Tenderness found.  Tenderness noted palmar side of hand with miminal edema noted in area of thumb. Patient reports "catching herself" with her left hand when she fell on her left knee.  Able to bend knee; ROM is limited due to pain during exam. No effusion present. No edema noted to calf. Tenderness present over patella   Neurological: She is alert and oriented to person, place, and time.  Gait is slow due to knee pain. She is able to ambulate independently  Skin: Skin is warm and dry.  Abrasion to left knee present approximately 1.5 cm x 2 cm without drainage present.       Assessment & Plan:  1. Left knee pain Rule out fracture  (patellar) following mechanical fall yesterday. Advised patient to elevate leg, use ibuprofen 600 mg every 6 hours for discomfort and x-ray results will be called to her and a plan of care will be established.  Low suspicion of fracture based upon exam today.  - DG Knee Complete 4 Views Left; Future  2. Left hand pain Rule out fracture following mechanical fall yesterday.  Low suspicion for fracture however patient reports tenderness with movement and edema is present.  - DG Hand Complete Left; Future  Patient extremely concerned for a fracture and is concerned about pain and discomfort. Will obtain X-rays to determine further plan of care. Roddie Mc, FNP-C

## 2015-09-10 NOTE — Progress Notes (Signed)
Pre visit review using our clinic review tool, if applicable. No additional management support is needed unless otherwise documented below in the visit note. 

## 2015-09-10 NOTE — Patient Instructions (Addendum)
We have ordered labs or studies at this visit. It can take up to 1-2 weeks for results and processing. IF results require follow up or explanation, we will call you with instructions. Clinically stable results will be released to your [facility]. If you have not heard from [place] or cannot find your results in [facility] in 2 weeks please contact our office at [phone].  If you are not yet signed up for [facility], please consider signing up   Ibuprofen 600 mg every 6 hours as needed for pain.  You may apply ice for 15 minutes and then discontinue use of ice for 15 minutes.    Further plan of care will be determined based upon X-ray results.

## 2015-10-02 ENCOUNTER — Other Ambulatory Visit: Payer: Self-pay | Admitting: Certified Nurse Midwife

## 2015-10-02 ENCOUNTER — Telehealth: Payer: Self-pay | Admitting: *Deleted

## 2015-10-02 DIAGNOSIS — N760 Acute vaginitis: Principal | ICD-10-CM

## 2015-10-02 DIAGNOSIS — B9689 Other specified bacterial agents as the cause of diseases classified elsewhere: Secondary | ICD-10-CM

## 2015-10-02 MED ORDER — METRONIDAZOLE 500 MG PO TABS: 500.0000 mg | ORAL_TABLET | Freq: Two times a day (BID) | ORAL | 0 refills | Status: DC

## 2015-10-02 NOTE — Telephone Encounter (Signed)
Hi Jane; She can use either a pack of LoLo or nuva ring to help with the bleeding, if it is excessive she may need PNV to help prevent anemia.   Thank you. R.Tafari Humiston CNM

## 2015-10-02 NOTE — Telephone Encounter (Signed)
Patient requested call back. Patient is having discharge similar to BV. She is requesting treatment. She has had the Nexplanon placed for a month and has had constant discharge and bleeding. Told patient I would send something for the BV and would talk to her provider about possibly sending a pack of pills in to stop the BTB she is having with the Nexplanon. She will check with the pharmacy.

## 2015-10-05 NOTE — Telephone Encounter (Signed)
Call to patient - LM on VM to CB with choice- can give her samples from the office.

## 2015-10-06 NOTE — Telephone Encounter (Signed)
Patient wants to use OCP. Samples of pills and prenatal vitamins given to patient. She states we can send generic to the pharmacy if we want.

## 2015-10-09 ENCOUNTER — Telehealth: Payer: Self-pay | Admitting: *Deleted

## 2015-10-14 ENCOUNTER — Other Ambulatory Visit: Payer: Self-pay | Admitting: *Deleted

## 2015-10-14 ENCOUNTER — Telehealth: Payer: Self-pay | Admitting: *Deleted

## 2015-10-14 DIAGNOSIS — T3695XA Adverse effect of unspecified systemic antibiotic, initial encounter: Principal | ICD-10-CM

## 2015-10-14 DIAGNOSIS — B379 Candidiasis, unspecified: Secondary | ICD-10-CM

## 2015-10-14 MED ORDER — FLUCONAZOLE 150 MG PO TABS: 150.0000 mg | ORAL_TABLET | Freq: Once | ORAL | 0 refills | Status: AC

## 2015-10-14 NOTE — Telephone Encounter (Signed)
Diflucan has been sent to pharmacy. Please make pt aware if she returns call to office.

## 2015-10-14 NOTE — Telephone Encounter (Signed)
Call returned. Message left to call office back.No reason for call was given by patient.

## 2015-10-14 NOTE — Progress Notes (Signed)
Pt called to office with complaints of yeast infection after completing antibiotic.  Pt was recently treated for BV with Flagyl. Per protocol, Diflucan 150mg  sent to pharmacy.  LM on pt VM to call office regarding Rx.

## 2015-10-21 ENCOUNTER — Telehealth: Payer: Self-pay | Admitting: *Deleted

## 2015-10-21 NOTE — Telephone Encounter (Signed)
Patient had been notified of lab results and has taken her medication.

## 2015-10-30 ENCOUNTER — Ambulatory Visit (INDEPENDENT_AMBULATORY_CARE_PROVIDER_SITE_OTHER): Payer: 59 | Admitting: Family Medicine

## 2015-10-30 ENCOUNTER — Encounter: Payer: Self-pay | Admitting: Family Medicine

## 2015-10-30 VITALS — BP 120/90 | HR 101 | Temp 98.5°F | Ht 63.0 in | Wt 155.1 lb

## 2015-10-30 DIAGNOSIS — J069 Acute upper respiratory infection, unspecified: Secondary | ICD-10-CM | POA: Diagnosis not present

## 2015-10-30 LAB — POCT INFLUENZA A/B
INFLUENZA A, POC: NEGATIVE
Influenza B, POC: NEGATIVE

## 2015-10-30 MED ORDER — BENZONATATE 100 MG PO CAPS: 100.0000 mg | ORAL_CAPSULE | Freq: Two times a day (BID) | ORAL | 0 refills | Status: DC | PRN

## 2015-10-30 NOTE — Progress Notes (Signed)
HPI:  Acute visit for:  URI -started: yesterday -symptoms:nasal congestion, sore throat, body aches, reports low grade fever this morning, cough -denies:fever, SOB, NVD, tooth pain -has tried: otc cold medication -sick contacts/travel/risks: no reported flu, strep or tick exposure -wants to check for flu  ROS: See pertinent positives and negatives per HPI.  Past Medical History:  Diagnosis Date  . Allergic disorder   . Bartholin cyst   . Gestational diabetes    2015  . Headache(784.0)   . Irregular menstrual cycle   . Pregnancy induced hypertension    with pregnancy in 2005  . Sickle cell trait (HCC)   . SROM (spontaneous rupture of membranes) 05/11/2011  . Urinary tract infection     Past Surgical History:  Procedure Laterality Date  . barthilon cyst    . THERAPEUTIC ABORTION      Family History  Problem Relation Age of Onset  . Alcohol abuse Neg Hx   . Arthritis Neg Hx   . Birth defects Neg Hx   . COPD Neg Hx   . Depression Neg Hx   . Drug abuse Neg Hx   . Early death Neg Hx   . Hearing loss Neg Hx   . Heart disease Neg Hx   . Hypertension Neg Hx   . Kidney disease Neg Hx   . Learning disabilities Neg Hx   . Mental illness Neg Hx   . Mental retardation Neg Hx   . Miscarriages / Stillbirths Neg Hx   . Stroke Neg Hx   . Vision loss Neg Hx   . Asthma Father   . Diabetes Sister   . Hyperlipidemia Sister   . Hyperlipidemia Sister   . Hyperlipidemia Sister   . Hyperlipidemia Sister   . Diabetes Sister   . Diabetes Sister   . Heart failure Father   . Cancer Sister   . Lupus Maternal Grandmother     Social History   Social History  . Marital status: Single    Spouse name: N/A  . Number of children: N/A  . Years of education: N/A   Social History Main Topics  . Smoking status: Never Smoker  . Smokeless tobacco: Never Used  . Alcohol use 0.0 oz/week     Comment: occasional  . Drug use: No  . Sexual activity: Yes    Partners: Male    Birth  control/ protection: Condom, IUD     Comment: mirena   Other Topics Concern  . None   Social History Narrative  . None     Current Outpatient Prescriptions:  .  Etonogestrel (NEXPLANON [place]), Inject into the skin., Disp: , Rfl:  .  benzonatate (TESSALON) 100 MG capsule, Take 1 capsule (100 mg total) by mouth 2 (two) times daily as needed for cough., Disp: 20 capsule, Rfl: 0  EXAM:  Vitals:   10/30/15 1403  BP: 120/90  Pulse: (!) 101  Temp: 98.5 F (36.9 C)    Body mass index is 27.47 kg/m.  GENERAL: vitals reviewed and listed above, alert, oriented, appears well hydrated and in no acute distress  HEENT: atraumatic, conjunttiva clear, no obvious abnormalities on inspection of external nose and ears, normal appearance of ear canals and TMs, clear nasal congestion, mild post oropharyngeal erythema with PND, no tonsillar edema or exudate, no sinus TTP  NECK: no obvious masses on inspection  LUNGS: clear to auscultation bilaterally, no wheezes, rales or rhonchi, good air movement  CV: HRRR, no peripheral edema  MS: moves all extremities without noticeable abnormality  PSYCH: pleasant and cooperative, no obvious depression or anxiety  ASSESSMENT AND PLAN:  Discussed the following assessment and plan:  Acute upper respiratory infection  -given HPI and exam findings today, a serious infection or illness is unlikely. We discussed potential etiologies, with VURI being most likely, and advised supportive care and monitoring. Rapid flu neg. We discussed treatment side effects, likely course, antibiotic misuse, transmission, and signs of developing a serious illness. Tessalon for cough. -of course, we advised to return or notify a doctor immediately if symptoms worsen or persist or new concerns arise.    Patient Instructions  INSTRUCTIONS FOR UPPER RESPIRATORY INFECTION:  -plenty of rest and fluids  -nasal saline wash 2-3 times daily (use prepackaged nasal saline or  bottled/distilled water if making your own)   -can use AFRIN nasal spray for drainage and nasal congestion - but do NOT use longer then 3-4 days  -can use tylenol (in no history of liver disease) or ibuprofen (if no history of kidney disease, bowel bleeding or significant heart disease) as directed for aches and sorethroat  -if you are taking a cough medication - use only as directed, may also try a teaspoon of honey to coat the throat and throat lozenges. -for sore throat, salt water gargles can help  -follow up if you have fevers, facial pain, tooth pain, difficulty breathing or are worsening or symptoms persist longer then expected  Upper Respiratory Infection, Adult An upper respiratory infection (URI) is also known as the common cold. It is often caused by a type of germ (virus). Colds are easily spread (contagious). You can pass it to others by kissing, coughing, sneezing, or drinking out of the same glass. Usually, you get better in 1 to 3  weeks.  However, the cough can last for even longer. HOME CARE   Only take medicine as told by your doctor. Follow instructions provided above.  Drink enough water and fluids to keep your pee (urine) clear or pale yellow.  Get plenty of rest.  Return to work when your temperature is < 100 for 24 hours or as told by your doctor. You may use a face mask and wash your hands to stop your cold from spreading. GET HELP RIGHT AWAY IF:   After the first few days, you feel you are getting worse.  You have questions about your medicine.  You have chills, shortness of breath, or red spit (mucus).  You have pain in the face for more then 1-2 days, especially when you bend forward.  You have a fever, puffy (swollen) neck, pain when you swallow, or white spots in the back of your throat.  You have a bad headache, ear pain, sinus pain, or chest pain.  You have a high-pitched whistling sound when you breathe in and out (wheezing).  You cough up  blood.  You have sore muscles or a stiff neck. MAKE SURE YOU:   Understand these instructions.  Will watch your condition.  Will get help right away if you are not doing well or get worse. Document Released: 07/20/2007 Document Revised: 04/25/2011 Document Reviewed: 05/08/2013 [facility]ExitCare Patient Information 2015 [place], [place]. This information is not intended to replace advice given to you by your health care provider. Make sure you discuss any questions you have with your health care provider.    Kriste BasqueKIM, Kwasi Joung R., DO

## 2015-10-30 NOTE — Addendum Note (Signed)
Addended by: Johnella MoloneyFUNDERBURK, JO A on: 10/30/2015 02:29 PM   Modules accepted: Orders

## 2015-10-30 NOTE — Patient Instructions (Signed)
INSTRUCTIONS FOR UPPER RESPIRATORY INFECTION:  -plenty of rest and fluids  -nasal saline wash 2-3 times daily (use prepackaged nasal saline or bottled/distilled water if making your own)   -can use AFRIN nasal spray for drainage and nasal congestion - but do NOT use longer then 3-4 days  -can use tylenol (in no history of liver disease) or ibuprofen (if no history of kidney disease, bowel bleeding or significant heart disease) as directed for aches and sorethroat  -if you are taking a cough medication - use only as directed, may also try a teaspoon of honey to coat the throat and throat lozenges.  -for sore throat, salt water gargles can help  -follow up if you have fevers, facial pain, tooth pain, difficulty breathing or are worsening or symptoms persist longer then expected  Upper Respiratory Infection, Adult An upper respiratory infection (URI) is also known as the common cold. It is often caused by a type of germ (virus). Colds are easily spread (contagious). You can pass it to others by kissing, coughing, sneezing, or drinking out of the same glass. Usually, you get better in 1 to 3  weeks.  However, the cough can last for even longer. HOME CARE   Only take medicine as told by your doctor. Follow instructions provided above.  Drink enough water and fluids to keep your pee (urine) clear or pale yellow.  Get plenty of rest.  Return to work when your temperature is < 100 for 24 hours or as told by your doctor. You may use a face mask and wash your hands to stop your cold from spreading. GET HELP RIGHT AWAY IF:   After the first few days, you feel you are getting worse.  You have questions about your medicine.  You have chills, shortness of breath, or red spit (mucus).  You have pain in the face for more then 1-2 days, especially when you bend forward.  You have a fever, puffy (swollen) neck, pain when you swallow, or white spots in the back of your throat.  You have a bad  headache, ear pain, sinus pain, or chest pain.  You have a high-pitched whistling sound when you breathe in and out (wheezing).  You cough up blood.  You have sore muscles or a stiff neck. MAKE SURE YOU:   Understand these instructions.  Will watch your condition.  Will get help right away if you are not doing well or get worse. Document Released: 07/20/2007 Document Revised: 04/25/2011 Document Reviewed: 05/08/2013 Exit[employer] Patient Information 2015 [employer]. This information is not intended to replace advice given to you by your health care provider. Make sure you discuss any questions you have with your health care provider.  

## 2015-10-30 NOTE — Progress Notes (Signed)
Pre visit review using our clinic review tool, if applicable. No additional management support is needed unless otherwise documented below in the visit note. 

## 2016-04-18 ENCOUNTER — Other Ambulatory Visit: Payer: Self-pay

## 2016-04-18 MED ORDER — FLUCONAZOLE 150 MG PO TABS: 150.0000 mg | ORAL_TABLET | Freq: Once | ORAL | 0 refills | Status: AC

## 2016-04-18 NOTE — Telephone Encounter (Signed)
Returned call, patient stated that she is having vaginal itching and discharge and would like Diflucan pill sent to pharmacy, routed to provider for review.

## 2016-04-19 ENCOUNTER — Telehealth: Payer: Self-pay

## 2016-04-19 NOTE — Telephone Encounter (Signed)
Patient called yesterday for rx for vaginal itching, provider sent rx to pharmacy. Contacted pharmacy and they stated pt picked up yesterday.

## 2016-07-28 ENCOUNTER — Telehealth: Payer: Self-pay

## 2016-07-28 DIAGNOSIS — B9689 Other specified bacterial agents as the cause of diseases classified elsewhere: Secondary | ICD-10-CM

## 2016-07-28 DIAGNOSIS — N760 Acute vaginitis: Principal | ICD-10-CM

## 2016-07-28 MED ORDER — METRONIDAZOLE 500 MG PO TABS: 500.0000 mg | ORAL_TABLET | Freq: Two times a day (BID) | ORAL | 0 refills | Status: AC

## 2016-07-28 NOTE — Telephone Encounter (Signed)
Pt called stating that she is having runny white vaginal discharge with odor. Pt request rx for BV. Per protocol rx sent to pharmacy.

## 2016-08-11 ENCOUNTER — Encounter: Payer: Self-pay | Admitting: Certified Nurse Midwife

## 2016-08-11 ENCOUNTER — Ambulatory Visit (INDEPENDENT_AMBULATORY_CARE_PROVIDER_SITE_OTHER): Payer: 59 | Admitting: Certified Nurse Midwife

## 2016-08-11 ENCOUNTER — Other Ambulatory Visit (HOSPITAL_COMMUNITY)
Admission: RE | Admit: 2016-08-11 | Discharge: 2016-08-11 | Disposition: A | Discharge status: Home / Self Care | Payer: 59 | Source: Ambulatory Visit | Attending: Certified Nurse Midwife | Admitting: Certified Nurse Midwife

## 2016-08-11 VITALS — BP 129/91 | HR 90 | Ht 64.0 in | Wt 161.4 lb

## 2016-08-11 DIAGNOSIS — Z124 Encounter for screening for malignant neoplasm of cervix: Secondary | ICD-10-CM | POA: Diagnosis not present

## 2016-08-11 DIAGNOSIS — Z113 Encounter for screening for infections with a predominantly sexual mode of transmission: Secondary | ICD-10-CM

## 2016-08-11 DIAGNOSIS — Z1151 Encounter for screening for human papillomavirus (HPV): Secondary | ICD-10-CM | POA: Diagnosis not present

## 2016-08-11 DIAGNOSIS — Z01419 Encounter for gynecological examination (general) (routine) without abnormal findings: Secondary | ICD-10-CM | POA: Insufficient documentation

## 2016-08-11 DIAGNOSIS — N898 Other specified noninflammatory disorders of vagina: Secondary | ICD-10-CM

## 2016-08-11 DIAGNOSIS — Z8632 Personal history of gestational diabetes: Secondary | ICD-10-CM

## 2016-08-11 DIAGNOSIS — R7303 Prediabetes: Secondary | ICD-10-CM

## 2016-08-11 MED ORDER — OB COMPLETE PETITE 35-5-1-200 MG PO CAPS: 1.0000 | ORAL_CAPSULE | Freq: Every day | ORAL | 12 refills | Status: DC

## 2016-08-11 NOTE — Progress Notes (Signed)
Subjective:        Lauren [name] is a 31 y.o. female here for a routine exam.  Current complaints: fatigue, reports waking up multiple times per night and having issues going to sleep.  Desires full STD screening exam.  Is currently sexually active.  Is working and going to school.   Does not regularly exercise.   Personal health questionnaire:  Is patient Ashkenazi Jewish, have a family history of breast and/or ovarian cancer: no Is there a family history of uterine cancer diagnosed at age < 5050, gastrointestinal cancer, urinary tract cancer, family member who is a Personnel officerLynch syndrome-associated carrier: no Is the patient overweight and hypertensive, family history of diabetes, personal history of gestational diabetes, preeclampsia or PCOS: yes Is patient over 6855, have PCOS,  family history of premature CHD under age 30, diabetes, smoke, have hypertension or peripheral artery disease:  no At any time, has a partner hit, kicked or otherwise hurt or frightened you?: no Over the past 2 weeks, have you felt down, depressed or hopeless?: no Over the past 2 weeks, have you felt little interest or pleasure in doing things?:no    Gynecologic History No LMP recorded. Patient has had an implant. Contraception: Nexplanon Last Pap: 07/29/15. Results were: normal Last mammogram: n/a.   Obstetric History OB History  Gravida Para Term Preterm AB Living  4 3 2 1 1 3   SAB TAB Ectopic Multiple Live Births  0 1 0 0 3    # Outcome Date GA Lbr Len/2nd Weight Sex Delivery Anes PTL Lv  4 Term 07/15/13 5842w1d 01:45 / 00:37 7 lb 9.7 oz (3.45 kg) M Vag-Spont EPI  LIV  3 Term 05/11/11 9349w0d 11:10 / 00:22 7 lb 6.9 oz (3.37 kg) M Vag-Spont EPI  LIV  2 Preterm 06/02/03 1282w5d   F Vag-Spont EPI  LIV     Birth Comments: induced for high blood pressure; borderline diabetic  1 TAB               Past Medical History:  Diagnosis Date  . Allergic disorder   . Bartholin cyst   . Gestational diabetes     2015  . Headache(784.0)   . Irregular menstrual cycle   . Pregnancy induced hypertension    with pregnancy in 2005  . Sickle cell trait (HCC)   . SROM (spontaneous rupture of membranes) 05/11/2011  . Urinary tract infection     Past Surgical History:  Procedure Laterality Date  . barthilon cyst    . THERAPEUTIC ABORTION       Current Outpatient Prescriptions:  .  Etonogestrel (NEXPLANON [place]), Inject into the skin., Disp: , Rfl:  .  Prenat-FeCbn-FeAspGl-FA-Omega (OB COMPLETE PETITE) 35-5-1-200 MG CAPS, Take 1 tablet by mouth daily., Disp: 30 capsule, Rfl: 12 No Known Allergies  Social History  Substance Use Topics  . Smoking status: Never Smoker  . Smokeless tobacco: Never Used  . Alcohol use 0.0 oz/week     Comment: occasional    Family History  Problem Relation Age of Onset  . Asthma Father   . Heart failure Father   . Diabetes Sister   . Hyperlipidemia Sister   . Hyperlipidemia Sister   . Hyperlipidemia Sister   . Hyperlipidemia Sister   . Diabetes Sister   . Diabetes Sister   . Cancer Sister   . Lupus Maternal Grandmother   . Alcohol abuse Neg Hx   . Arthritis Neg Hx   . Birth defects Neg  Hx   . COPD Neg Hx   . Depression Neg Hx   . Drug abuse Neg Hx   . Early death Neg Hx   . Hearing loss Neg Hx   . Heart disease Neg Hx   . Hypertension Neg Hx   . Kidney disease Neg Hx   . Learning disabilities Neg Hx   . Mental illness Neg Hx   . Mental retardation Neg Hx   . Miscarriages / Stillbirths Neg Hx   . Stroke Neg Hx   . Vision loss Neg Hx       Review of Systems  Constitutional: negative for fatigue and weight loss Respiratory: negative for cough and wheezing Cardiovascular: negative for chest pain, fatigue and palpitations Gastrointestinal: negative for abdominal pain and change in bowel habits Musculoskeletal:negative for myalgias Neurological: negative for gait problems and tremors Behavioral/Psych: negative for abusive relationship,  depression Endocrine: negative for temperature intolerance    Genitourinary:negative for abnormal menstrual periods, genital lesions, hot flashes, sexual problems and vaginal discharge Integument/breast: negative for breast lump, breast tenderness, nipple discharge and skin lesion(s)    Objective:       BP (!) 129/91   Pulse 90   Ht 5\' 4"  (1.626 m)   Wt 161 lb 6.4 oz (73.2 kg)   BMI 27.70 kg/m  General:   alert  Skin:   no rash or abnormalities  Lungs:   clear to auscultation bilaterally  Heart:   regular rate and rhythm, S1, S2 normal, no murmur, click, rub or gallop  Breasts:   normal without suspicious masses, skin or nipple changes or axillary nodes  Abdomen:  normal findings: no organomegaly, soft, non-tender and no hernia  Pelvis:  External genitalia: normal general appearance Urinary system: urethral meatus normal and bladder without fullness, nontender Vaginal: normal without tenderness, induration or masses Cervix: normal appearance Adnexa: normal bimanual exam Uterus: anteverted and non-tender, normal size   Lab Review Urine pregnancy test Labs reviewed yes Radiologic studies reviewed no  50% of 30 min visit spent on counseling and coordination of care.    Assessment:    Healthy female exam.   Vaginal discharge  Obese  Prediabetes  Plan:    Education reviewed: calcium supplements, depression evaluation, low fat, low cholesterol diet, safe sex/STD prevention, self breast exams, skin cancer screening and weight bearing exercise. Contraception: Nexplanon. Follow up in: 1 year.   Meds ordered this encounter  Medications  . Prenat-FeCbn-FeAspGl-FA-Omega (OB COMPLETE PETITE) 35-5-1-200 MG CAPS    Sig: Take 1 tablet by mouth daily.    Dispense:  30 capsule    Refill:  12   Orders Placed This Encounter  Procedures  . Hemoglobin A1c  . RPR  . Hepatitis C antibody  . Hepatitis B surface antigen  . HIV antibody

## 2016-08-12 LAB — HEPATITIS C ANTIBODY

## 2016-08-12 LAB — HEMOGLOBIN A1C
ESTIMATED AVERAGE GLUCOSE: 143 mg/dL
Hgb A1c MFr Bld: 6.6 % — ABNORMAL HIGH (ref 4.8–5.6)

## 2016-08-12 LAB — HIV ANTIBODY (ROUTINE TESTING W REFLEX): HIV SCREEN 4TH GENERATION: NONREACTIVE

## 2016-08-12 LAB — RPR: RPR Ser Ql: NONREACTIVE

## 2016-08-12 LAB — CERVICOVAGINAL ANCILLARY ONLY
Bacterial vaginitis: NEGATIVE
Candida vaginitis: NEGATIVE
Chlamydia: NEGATIVE
NEISSERIA GONORRHEA: NEGATIVE
TRICH (WINDOWPATH): NEGATIVE

## 2016-08-12 LAB — HEPATITIS B SURFACE ANTIGEN: HEP B S AG: NEGATIVE

## 2016-08-16 ENCOUNTER — Other Ambulatory Visit: Payer: Self-pay | Admitting: Certified Nurse Midwife

## 2016-08-16 LAB — CYTOLOGY - PAP
Diagnosis: NEGATIVE
HPV: NOT DETECTED

## 2016-08-23 ENCOUNTER — Other Ambulatory Visit (INDEPENDENT_AMBULATORY_CARE_PROVIDER_SITE_OTHER): Payer: 59

## 2016-08-23 ENCOUNTER — Ambulatory Visit (INDEPENDENT_AMBULATORY_CARE_PROVIDER_SITE_OTHER): Payer: 59 | Admitting: Nurse Practitioner

## 2016-08-23 ENCOUNTER — Encounter: Payer: Self-pay | Admitting: Nurse Practitioner

## 2016-08-23 VITALS — BP 126/84 | HR 89 | Temp 98.9°F | Ht 64.0 in | Wt 157.0 lb

## 2016-08-23 DIAGNOSIS — E119 Type 2 diabetes mellitus without complications: Secondary | ICD-10-CM

## 2016-08-23 DIAGNOSIS — R5383 Other fatigue: Secondary | ICD-10-CM

## 2016-08-23 DIAGNOSIS — Z136 Encounter for screening for cardiovascular disorders: Secondary | ICD-10-CM

## 2016-08-23 DIAGNOSIS — Z13220 Encounter for screening for lipoid disorders: Secondary | ICD-10-CM

## 2016-08-23 DIAGNOSIS — E782 Mixed hyperlipidemia: Secondary | ICD-10-CM | POA: Diagnosis not present

## 2016-08-23 LAB — COMPREHENSIVE METABOLIC PANEL
ALK PHOS: 60 U/L (ref 39–117)
ALT: 21 U/L (ref 0–35)
AST: 14 U/L (ref 0–37)
Albumin: 4.6 g/dL (ref 3.5–5.2)
BILIRUBIN TOTAL: 0.6 mg/dL (ref 0.2–1.2)
BUN: 12 mg/dL (ref 6–23)
CO2: 26 mEq/L (ref 19–32)
Calcium: 9.6 mg/dL (ref 8.4–10.5)
Chloride: 103 mEq/L (ref 96–112)
Creatinine, Ser: 0.98 mg/dL (ref 0.40–1.20)
GFR: 85.55 mL/min (ref >60.00)
GLUCOSE: 117 mg/dL — AB (ref 70–99)
Potassium: 4.5 mEq/L (ref 3.5–5.1)
Sodium: 139 mEq/L (ref 135–145)
TOTAL PROTEIN: 7.9 g/dL (ref 6.0–8.3)

## 2016-08-23 LAB — LIPID PANEL
Cholesterol: 224 mg/dL — ABNORMAL HIGH (ref 0–200)
HDL: 40.7 mg/dL (ref >39.00)
NONHDL: 183.07
TRIGLYCERIDES: 237 mg/dL — AB (ref 0.0–149.0)
Total CHOL/HDL Ratio: 5
VLDL: 47.4 mg/dL — ABNORMAL HIGH (ref 0.0–40.0)

## 2016-08-23 LAB — LDL CHOLESTEROL, DIRECT: Direct LDL: 156 mg/dL

## 2016-08-23 LAB — TSH: TSH: 1.06 u[IU]/mL (ref 0.35–4.50)

## 2016-08-23 MED ORDER — METFORMIN HCL ER 500 MG PO TB24: 500.0000 mg | ORAL_TABLET | Freq: Every day | ORAL | 2 refills | Status: DC

## 2016-08-23 NOTE — Patient Instructions (Addendum)
Go to basement for blood draw. You will be called with lab results.  Diabetes Mellitus and Food It is important for you to manage your blood sugar (glucose) level. Your blood glucose level can be greatly affected by what you eat. Eating healthier foods in the appropriate amounts throughout the day at about the same time each day will help you control your blood glucose level. It can also help slow or prevent worsening of your diabetes mellitus. Healthy eating may even help you improve the level of your blood pressure and reach or maintain a healthy weight. General recommendations for healthful eating and cooking habits include:  Eating meals and snacks regularly. Avoid going long periods of time without eating to lose weight.  Eating a diet that consists mainly of plant-based foods, such as fruits, vegetables, nuts, legumes, and whole grains.  Using low-heat cooking methods, such as baking, instead of high-heat cooking methods, such as deep frying.  Work with your dietitian to make sure you understand how to use the Nutrition Facts information on food labels. How can food affect me? Carbohydrates Carbohydrates affect your blood glucose level more than any other type of food. Your dietitian will help you determine how many carbohydrates to eat at each meal and teach you how to count carbohydrates. Counting carbohydrates is important to keep your blood glucose at a healthy level, especially if you are using insulin or taking certain medicines for diabetes mellitus. Alcohol Alcohol can cause sudden decreases in blood glucose (hypoglycemia), especially if you use insulin or take certain medicines for diabetes mellitus. Hypoglycemia can be a life-threatening condition. Symptoms of hypoglycemia (sleepiness, dizziness, and disorientation) are similar to symptoms of having too much alcohol. If your health care provider has given you approval to drink alcohol, do so in moderation and use the following  guidelines:  Women should not have more than one drink per day, and men should not have more than two drinks per day. One drink is equal to: ? 12 oz of beer. ? 5 oz of wine. ? 1 oz of hard liquor.  Do not drink on an empty stomach.  Keep yourself hydrated. Have water, diet soda, or unsweetened iced tea.  Regular soda, juice, and other mixers might contain a lot of carbohydrates and should be counted.  What foods are not recommended? As you make food choices, it is important to remember that all foods are not the same. Some foods have fewer nutrients per serving than other foods, even though they might have the same number of calories or carbohydrates. It is difficult to get your body what it needs when you eat foods with fewer nutrients. Examples of foods that you should avoid that are high in calories and carbohydrates but low in nutrients include:  Trans fats (most processed foods list trans fats on the Nutrition Facts label).  Regular soda.  Juice.  Candy.  Sweets, such as cake, pie, doughnuts, and cookies.  Fried foods.  What foods can I eat? Eat nutrient-rich foods, which will nourish your body and keep you healthy. The food you should eat also will depend on several factors, including:  The calories you need.  The medicines you take.  Your weight.  Your blood glucose level.  Your blood pressure level.  Your cholesterol level.  You should eat a variety of foods, including:  Protein. ? Lean cuts of meat. ? Proteins low in saturated fats, such as fish, egg whites, and beans. Avoid processed meats.  Fruits and vegetables. ?  Fruits and vegetables that may help control blood glucose levels, such as apples, mangoes, and yams.  Dairy products. ? Choose fat-free or low-fat dairy products, such as milk, yogurt, and cheese.  Grains, bread, pasta, and rice. ? Choose whole grain products, such as multigrain bread, whole oats, and brown rice. These foods may help  control blood pressure.  Fats. ? Foods containing healthful fats, such as nuts, avocado, olive oil, canola oil, and fish.  Does everyone with diabetes mellitus have the same meal plan? Because every person with diabetes mellitus is different, there is not one meal plan that works for everyone. It is very important that you meet with a dietitian who will help you create a meal plan that is just right for you. This information is not intended to replace advice given to you by your health care provider. Make sure you discuss any questions you have with your health care provider. Document Released: 10/28/2004 Document Revised: 07/09/2015 Document Reviewed: 12/28/2012 Elsevier Interactive Patient Education  2017 [employer]   Hyperglycemia Hyperglycemia occurs when the level of sugar (glucose) in the blood is too high. Glucose is a type of sugar that provides the body's main source of energy. Certain hormones (insulin and glucagon) control the level of glucose in the blood. Insulin lowers blood glucose, and glucagon increases blood glucose. Hyperglycemia can result from having too little insulin in the bloodstream, or from the body not responding normally to insulin. Hyperglycemia occurs most often in people who have diabetes (diabetes mellitus), but it can happen in people who do not have diabetes. It can develop quickly, and it can be life-threatening if it causes you to become severely dehydrated (diabetic ketoacidosis or hyperglycemic hyperosmolar state). Severe hyperglycemia is a medical emergency. What are the causes? If you have diabetes, hyperglycemia may be caused by:  Diabetes medicine.  Medicines that increase blood glucose or affect your diabetes control.  Not eating enough, or not eating often enough.  Changes in physical activity level.  Being sick or having an infection.  If you have prediabetes or undiagnosed diabetes:  Hyperglycemia may be caused by those  conditions.  If you do not have diabetes, hyperglycemia may be caused by:  Certain medicines, including steroid medicines, beta-blockers, epinephrine, and thiazide diuretics.  Stress.  Serious illness.  Surgery.  Diseases of the pancreas.  Infection.  What increases the risk? Hyperglycemia is more likely to develop in people who have risk factors for diabetes, such as:  Having a family member with diabetes.  Having a gene for type 1 diabetes that is passed from parent to child (inherited).  Living in an area with cold weather conditions.  Exposure to certain viruses.  Certain conditions in which the body's disease-fighting (immune) system attacks itself (autoimmune disorders).  Being overweight or obese.  Having an inactive (sedentary) lifestyle.  Having been diagnosed with insulin resistance.  Having a history of prediabetes, gestational diabetes, or polycystic ovarian syndrome (PCOS).  Being of American-Indian, African-American, Hispanic/Latino, or Asian/Pacific Islander descent.  What are the signs or symptoms? Hyperglycemia may not cause any symptoms. If you do have symptoms, they may include early warning signs, such as:  Increased thirst.  Hunger.  Feeling very tired.  Needing to urinate more often than usual.  Blurry vision.  Other symptoms may develop if hyperglycemia gets worse, such as:  Dry mouth.  Loss of appetite.  Fruity-smelling breath.  Weakness.  Unexpected or rapid weight gain or weight loss.  Tingling or numbness in the  hands or feet.  Headache.  Skin that does not quickly return to normal after being lightly pinched and released (poor skin turgor).  Abdominal pain.  Cuts or bruises that are slow to heal.  How is this diagnosed? Hyperglycemia is diagnosed with a blood test to measure your blood glucose level. This blood test is usually done while you are having symptoms. Your health care provider may also do a physical exam  and review your medical history. You may have more tests to determine the cause of your hyperglycemia, such as:  A fasting blood glucose (FBG) test. You will not be allowed to eat (you will fast) for at least 8 hours before a blood sample is taken.  An A1c (hemoglobin A1c) blood test. This provides information about blood glucose control over the previous 2-3 months.  An oral glucose tolerance test (OGTT). This measures your blood glucose at two times: ? After fasting. This is your baseline blood glucose level. ? Two hours after drinking a beverage that contains glucose.  How is this treated? Treatment depends on the cause of your hyperglycemia. Treatment may include:  Taking medicine to regulate your blood glucose levels. If you take insulin or other diabetes medicines, your medicine or dosage may be adjusted.  Lifestyle changes, such as exercising more, eating healthier foods, or losing weight.  Treating an illness or infection, if this caused your hyperglycemia.  Checking your blood glucose more often.  Stopping or reducing steroid medicines, if these caused your hyperglycemia.  If your hyperglycemia becomes severe and it results in hyperglycemic hyperosmolar state, you must be hospitalized and given IV fluids. Follow these instructions at home: General instructions  Take over-the-counter and prescription medicines only as told by your health care provider.  Do not use any products that contain nicotine or tobacco, such as cigarettes and e-cigarettes. If you need help quitting, ask your health care provider.  Limit alcohol intake to no more than 1 drink per day for nonpregnant women and 2 drinks per day for men. One drink equals 12 oz of beer, 5 oz of wine, or 1 oz of hard liquor.  Learn to manage stress. If you need help with this, ask your health care provider.  Keep all follow-up visits as told by your health care provider. This is important. Eating and  drinking  Maintain a healthy weight.  Exercise regularly, as directed by your health care provider.  Stay hydrated, especially when you exercise, get sick, or spend time in hot temperatures.  Eat healthy foods, such as: ? Lean proteins. ? Complex carbohydrates. ? Fresh fruits and vegetables. ? Low-fat dairy products. ? Healthy fats.  Drink enough fluid to keep your urine clear or pale yellow. If you have diabetes:   Make sure you know the symptoms of hyperglycemia.  Follow your diabetes management plan, as told by your health care provider. Make sure you: ? Take your insulin and medicines as directed. ? Follow your exercise plan. ? Follow your meal plan. Eat on time, and do not skip meals. ? Check your blood glucose as often as directed. Make sure to check your blood glucose before and after exercise. If you exercise longer or in a different way than usual, check your blood glucose more often. ? Follow your sick day plan whenever you cannot eat or drink normally. Make this plan in advance with your health care provider.  Share your diabetes management plan with people in your workplace, school, and household.  Check your urine  for ketones when you are ill and as told by your health care provider.  Carry a medical alert card or wear medical alert jewelry. Contact a health care provider if:  Your blood glucose is at or above 240 mg/dL (16.1 mmol/L) for 2 days in a row.  You have problems keeping your blood glucose in your target range.  You have frequent episodes of hyperglycemia. Get help right away if:  You have difficulty breathing.  You have a change in how you think, feel, or act (mental status).  You have nausea or vomiting that does not go away. These symptoms may represent a serious problem that is an emergency. Do not wait to see if the symptoms will go away. Get medical help right away. Call your local emergency services (911 in the [place] Do not drive yourself  to the hospital. Summary  Hyperglycemia occurs when the level of sugar (glucose) in the blood is too high.  Hyperglycemia is diagnosed with a blood test to measure your blood glucose level. This blood test is usually done while you are having symptoms. Your health care provider may also do a physical exam and review your medical history.  If you have diabetes, follow your diabetes management plan as told by your health care provider.  Contact your health care provider if you have problems keeping your blood glucose in your target range. This information is not intended to replace advice given to you by your health care provider. Make sure you discuss any questions you have with your health care provider. Document Released: 07/27/2000 Document Revised: 10/19/2015 Document Reviewed: 10/19/2015 Elsevier Interactive Patient Education  [employer].

## 2016-08-23 NOTE — Progress Notes (Signed)
Subjective:  Patient ID: Lauren [name], female    DOB: [birth year]  Age: 31 y.o. MRN: [id number]  CC: Follow-up (A1C consult--went to GYN 3 wks ago--got A1C of 6.6/ )   HPI DM: New diagnosis Hx of gestational DM, FH of DM (sisters and parents)  she started Exercise 2weeks ago: walking and zumba 4x/week. She does not follow any particular diet.  Hyperlipidemia: Abnormal lipid panel done by [facility] 05/2016. Fasting today.  Outpatient Medications Prior to Visit  Medication Sig Dispense Refill  . Etonogestrel (NEXPLANON [place]) Inject into the skin.    . Prenat-FeCbn-FeAspGl-FA-Omega (OB COMPLETE PETITE) 35-5-1-200 MG CAPS Take 1 tablet by mouth daily. (Patient not taking: Reported on 08/23/2016) 30 capsule 12   No facility-administered medications prior to visit.     ROS Review of Systems  Constitutional: Positive for malaise/fatigue. Negative for chills, fever and weight loss.  Respiratory: Negative for shortness of breath.   Cardiovascular: Negative for chest pain, palpitations and leg swelling.  Gastrointestinal: Negative for abdominal pain, constipation, diarrhea, nausea and vomiting.  Musculoskeletal: Negative for falls, joint pain and myalgias.  Neurological: Negative for dizziness, sensory change, weakness and headaches.  Endo/Heme/Allergies: Negative for polydipsia.  Psychiatric/Behavioral: Negative for depression, substance abuse and suicidal ideas. The patient is nervous/anxious.     Objective:  BP 126/84   Pulse 89   Temp 98.9 F (37.2 C)   Ht 5\' 4"  (1.626 m)   Wt 157 lb (71.2 kg)   SpO2 98%   BMI 26.95 kg/m   BP Readings from Last 3 Encounters:  08/23/16 126/84  08/11/16 (!) 129/91  10/30/15 120/90    Wt Readings from Last 3 Encounters:  08/23/16 157 lb (71.2 kg)  08/11/16 161 lb 6.4 oz (73.2 kg)  10/30/15 155 lb 1.6 oz (70.4 kg)    Physical Exam  Constitutional: She is oriented to person, place, and time. No distress.  Neck: Normal  range of motion. Neck supple. No thyromegaly present.  Cardiovascular: Normal rate, regular rhythm and normal heart sounds.   Pulmonary/Chest: Effort normal and breath sounds normal. No respiratory distress.  Musculoskeletal: Normal range of motion. She exhibits no edema.  Neurological: She is alert and oriented to person, place, and time.  Skin: Skin is warm and dry.  Psychiatric: She has a normal mood and affect. Her behavior is normal.  Vitals reviewed.   Lab Results  Component Value Date   WBC 9.4 07/16/2013   HGB 12.8 07/16/2013   HCT 37.9 07/16/2013   PLT 176 07/16/2013   GLUCOSE 117 (H) 08/23/2016   CHOL 224 (H) 08/23/2016   TRIG 237.0 (H) 08/23/2016   HDL 40.70 08/23/2016   LDLDIRECT 156.0 08/23/2016   ALT 21 08/23/2016   AST 14 08/23/2016   NA 139 08/23/2016   K 4.5 08/23/2016   CL 103 08/23/2016   CREATININE 0.98 08/23/2016   BUN 12 08/23/2016   CO2 26 08/23/2016   TSH 1.06 08/23/2016   HGBA1C 6.6 (H) 08/11/2016    No results found.  Assessment & Plan:   Lauren [name] was seen today for follow-up.  Diagnoses and all orders for this visit:  Type 2 diabetes mellitus without complication, without long-term current use of insulin (HCC) -     metFORMIN (GLUCOPHAGE-XR) 500 MG 24 hr tablet; Take 1 tablet (500 mg total) by mouth daily with breakfast. -     Comprehensive metabolic panel; Future -     TSH; Future  Fatigue, unspecified type -  Comprehensive metabolic panel; Future -     TSH; Future  Mixed hyperlipidemia -     Lipid panel; Future   I have discontinued Ms. Lauren [name]'s OB COMPLETE PETITE. I am also having her start on metFORMIN. Additionally, I am having her maintain her Etonogestrel (NEXPLANON [place]).  Meds ordered this encounter  Medications  . metFORMIN (GLUCOPHAGE-XR) 500 MG 24 hr tablet    Sig: Take 1 tablet (500 mg total) by mouth daily with breakfast.    Dispense:  30 tablet    Refill:  2    Order Specific Question:   Supervising Provider     Answer:   Tresa GarterPLOTNIKOV, ALEKSEI V [[id number]]    Follow-up: Return in about 3 months (around 11/23/2016) for DM, hyperlipidemia (fasting).  Alysia Pennaharlotte Nche, NP

## 2016-08-25 ENCOUNTER — Encounter: Payer: Self-pay | Admitting: Nurse Practitioner

## 2016-08-25 ENCOUNTER — Telehealth: Payer: Self-pay | Admitting: Internal Medicine

## 2016-08-25 NOTE — Telephone Encounter (Signed)
Pt informed of lab results. 

## 2016-09-03 ENCOUNTER — Encounter (HOSPITAL_COMMUNITY): Payer: Self-pay | Admitting: *Deleted

## 2016-09-03 ENCOUNTER — Ambulatory Visit (HOSPITAL_COMMUNITY)
Admission: EM | Admit: 2016-09-03 | Discharge: 2016-09-03 | Disposition: A | Discharge status: Home / Self Care | Payer: 59 | Attending: Radiology | Admitting: Radiology

## 2016-09-03 ENCOUNTER — Ambulatory Visit (INDEPENDENT_AMBULATORY_CARE_PROVIDER_SITE_OTHER): Payer: 59

## 2016-09-03 DIAGNOSIS — S60221A Contusion of right hand, initial encounter: Secondary | ICD-10-CM

## 2016-09-03 MED ORDER — IBUPROFEN 800 MG PO TABS: 800.0000 mg | ORAL_TABLET | Freq: Three times a day (TID) | ORAL | 0 refills | Status: DC

## 2016-09-03 NOTE — ED Triage Notes (Signed)
Pt  Reports  4  Days  Ago  Her  r  Hand  Was  Slammed  In  A  Door   She  Has  Pain  Swelling  And  Bruising         To  The affected    Hand

## 2016-09-03 NOTE — ED Provider Notes (Signed)
CSN: [id number]     Arrival date & time 09/03/16  1308 History   None    Chief Complaint  Patient presents with  . Hand Injury   (Consider location/radiation/quality/duration/timing/severity/associated sxs/prior Treatment) 31y.o. female presents with injuries to right hand after shutting in the home door X. 4 days ago. Patient reporting worsening pain and swelling. Condition is acute in nature. Condition is made better by nothing. Condition is made worse by noth9ing. Patient denies any relief from nothing prior to there arrival at this facility. Patient has full ROM of fingers and wrist including flextion, exteniosn. Abduction and adducation. Skin is warm and dry. Pulses intact. Unable able to assess cap refill due to patients nail/.        Past Medical History:  Diagnosis Date  . Allergic disorder   . Bartholin cyst   . Gestational diabetes    2015  . Headache(784.0)   . Irregular menstrual cycle   . Pregnancy induced hypertension    with pregnancy in 2005  . Sickle cell trait (HCC)   . SROM (spontaneous rupture of membranes) 05/11/2011  . Urinary tract infection    Past Surgical History:  Procedure Laterality Date  . barthilon cyst    . THERAPEUTIC ABORTION     Family History  Problem Relation Age of Onset  . Asthma Father   . Heart failure Father   . Diabetes Sister   . Hyperlipidemia Sister   . Hyperlipidemia Sister   . Hyperlipidemia Sister   . Hyperlipidemia Sister   . Diabetes Sister   . Diabetes Sister   . Cancer Sister   . Lupus Maternal Grandmother   . Alcohol abuse Neg Hx   . Arthritis Neg Hx   . Birth defects Neg Hx   . COPD Neg Hx   . Depression Neg Hx   . Drug abuse Neg Hx   . Early death Neg Hx   . Hearing loss Neg Hx   . Heart disease Neg Hx   . Hypertension Neg Hx   . Kidney disease Neg Hx   . Learning disabilities Neg Hx   . Mental illness Neg Hx   . Mental retardation Neg Hx   . Miscarriages / Stillbirths Neg Hx   . Stroke Neg Hx   .  Vision loss Neg Hx    Social History  Substance Use Topics  . Smoking status: Never Smoker  . Smokeless tobacco: Never Used  . Alcohol use 0.0 oz/week     Comment: occasional   OB History    Gravida Para Term Preterm AB Living   4 3 2 1 1 3    SAB TAB Ectopic Multiple Live Births   0 1 0 0 3     Review of Systems  Constitutional: Negative for chills and fever.  HENT: Negative for ear pain and sore throat.   Eyes: Negative for pain and visual disturbance.  Respiratory: Negative for cough and shortness of breath.   Cardiovascular: Negative for chest pain and palpitations.  Gastrointestinal: Negative for abdominal pain and vomiting.  Genitourinary: Negative for dysuria and hematuria.  Musculoskeletal: Negative for arthralgias and back pain.       Pain to right hand  Skin: Negative for color change and rash.  Neurological: Negative for seizures and syncope.  All other systems reviewed and are negative.   Allergies  Patient has no known allergies.  Home Medications   Prior to Admission medications   Medication Sig Start Date End Date Taking?  Authorizing Provider  Etonogestrel (NEXPLANON [place]) Inject into the skin.    [provider]  ibuprofen (ADVIL,MOTRIN) 800 MG tablet Take 1 tablet (800 mg total) by mouth 3 (three) times daily. 09/03/16   Alene Miresmohundro, Donna Silverman C, NP  metFORMIN (GLUCOPHAGE-XR) 500 MG 24 hr tablet Take 1 tablet (500 mg total) by mouth daily with breakfast. 08/23/16   Nche, Bonna Gainsharlotte Lum, NP   Meds Ordered and Administered this Visit  Medications - No data to display  BP 122/70 (BP Location: Right Arm)   Pulse 78   Temp 98.6 F (37 C) (Oral)   Resp 18   SpO2 100%  No data found.   Physical Exam  Constitutional: She is oriented to person, place, and time. She appears well-developed and well-nourished.  HENT:  Head: Normocephalic and atraumatic.  Eyes: Conjunctivae are normal.  Neck: Normal range of motion.  Pulmonary/Chest: Effort normal.   Musculoskeletal: Normal range of motion. She exhibits edema.  Bruising noted to posterior distal 3rd and 4th metacarpal and anterior carpal   Neurological: She is alert and oriented to person, place, and time.  Psychiatric: She has a normal mood and affect.  Nursing note and vitals reviewed.   Urgent Care Course     Procedures (including critical care time)  Labs Review Labs Reviewed - No data to display  Imaging Review Dg Hand Complete Right  Result Date: 09/03/2016 CLINICAL DATA:  31 year old female with right hand injury sustained at work EXAM: RIGHT HAND - COMPLETE 3+ VIEW COMPARISON:  None. FINDINGS: There is no evidence of fracture or dislocation. There is no evidence of arthropathy or other focal bone abnormality. Mild soft tissue swelling over the dorsum of the hand. IMPRESSION: Soft tissue swelling over the dorsal aspect of the metacarpals without evidence of underlying fracture. Electronically Signed   By: Malachy MoanHeath  McCullough M.D.   On: 09/03/2016 14:34         MDM   1. Contusion of right hand, initial encounter       Alene MiresOmohundro, Rexton Greulich C, NP 09/03/16 1451

## 2016-09-22 ENCOUNTER — Other Ambulatory Visit: Payer: Self-pay | Admitting: *Deleted

## 2016-09-22 MED ORDER — FLUCONAZOLE 150 MG PO TABS: 150.0000 mg | ORAL_TABLET | Freq: Once | ORAL | 0 refills | Status: AC

## 2016-09-22 NOTE — Progress Notes (Signed)
Pt called to office with symptoms of yeast infection, would like tx. Diflucan sent per protocol. 

## 2016-10-24 ENCOUNTER — Telehealth: Payer: Self-pay | Admitting: Nurse Practitioner

## 2016-10-24 DIAGNOSIS — E119 Type 2 diabetes mellitus without complications: Secondary | ICD-10-CM

## 2016-10-24 MED ORDER — METFORMIN HCL ER 500 MG PO TB24: 500.0000 mg | ORAL_TABLET | Freq: Every day | ORAL | 0 refills | Status: DC

## 2016-10-24 NOTE — Telephone Encounter (Signed)
Receive refill request for metformin ER 500 mg tab, send in 90 days supply. Make sure pt keep an appt with 11/24/2016 for more refills.

## 2016-11-15 ENCOUNTER — Ambulatory Visit (INDEPENDENT_AMBULATORY_CARE_PROVIDER_SITE_OTHER): Payer: 59 | Admitting: Internal Medicine

## 2016-11-15 ENCOUNTER — Encounter: Payer: Self-pay | Admitting: Internal Medicine

## 2016-11-15 ENCOUNTER — Other Ambulatory Visit (INDEPENDENT_AMBULATORY_CARE_PROVIDER_SITE_OTHER): Payer: 59

## 2016-11-15 VITALS — BP 118/78 | HR 87 | Temp 98.1°F | Ht 64.0 in | Wt 156.0 lb

## 2016-11-15 DIAGNOSIS — E782 Mixed hyperlipidemia: Secondary | ICD-10-CM

## 2016-11-15 DIAGNOSIS — E119 Type 2 diabetes mellitus without complications: Secondary | ICD-10-CM | POA: Diagnosis not present

## 2016-11-15 LAB — LDL CHOLESTEROL, DIRECT: LDL DIRECT: 120 mg/dL

## 2016-11-15 LAB — LIPID PANEL
CHOL/HDL RATIO: 5
Cholesterol: 192 mg/dL (ref 0–200)
HDL: 41.7 mg/dL (ref >39.00)
NONHDL: 150.69
TRIGLYCERIDES: 270 mg/dL — AB (ref 0.0–149.0)
VLDL: 54 mg/dL — ABNORMAL HIGH (ref 0.0–40.0)

## 2016-11-15 LAB — HEMOGLOBIN A1C: Hgb A1c MFr Bld: 5.9 % (ref 4.6–6.5)

## 2016-11-15 NOTE — Progress Notes (Signed)
   Subjective:    Patient ID: Lauren [name], female    DOB: 1985-11-20, 31 y.o.   MRN: [id number]  HPI The patient is a 31 YO female coming in for FMLA forms. She has not been seen here by this provider since 2016 but came in over the summer once after her gyn diagnosed her with diabetes. She states that someone in her work told her to get FMLA filled out for doctor visits. I ask her if she has been to other placed or doctors and she mentions some nurse which has come out from her insurance company and that they arranged her to see a kidney specialist soon. She is not sure why. She is taking metformin without side effects. Denies numbness or burning in her feet. Has not made any changes to her diet since last visit about 3 months ago.   Review of Systems  Constitutional: Negative.   HENT: Negative.   Eyes: Negative.   Respiratory: Negative for cough, chest tightness and shortness of breath.   Cardiovascular: Negative for chest pain, palpitations and leg swelling.  Gastrointestinal: Negative for abdominal distention, abdominal pain, constipation, diarrhea, nausea and vomiting.  Musculoskeletal: Negative.   Skin: Negative.   Neurological: Negative.   Psychiatric/Behavioral: Negative.       Objective:   Physical Exam  Constitutional: She is oriented to person, place, and time. She appears well-developed and well-nourished.  HENT:  Head: Normocephalic and atraumatic.  Eyes: EOM are normal.  Neck: Normal range of motion.  Cardiovascular: Normal rate and regular rhythm.   Pulmonary/Chest: Effort normal and breath sounds normal. No respiratory distress. She has no wheezes. She has no rales.  Abdominal: Soft. Bowel sounds are normal. She exhibits no distension. There is no tenderness. There is no rebound.  Musculoskeletal: She exhibits no edema.  Neurological: She is alert and oriented to person, place, and time. Coordination normal.  Skin: Skin is warm and dry.  Psychiatric: She has a  normal mood and affect.   Vitals:   11/15/16 1601  BP: 118/78  Pulse: 87  Temp: 98.1 F (36.7 C)  TempSrc: Oral  SpO2: 100%  Weight: 156 lb (70.8 kg)  Height:  (1.626 m)      Assessment & Plan:

## 2016-11-15 NOTE — Patient Instructions (Signed)
We will check the labs today and call you back about the results.   We can see you back in about 3-6 months for follow up on the sugars.

## 2016-11-16 NOTE — Assessment & Plan Note (Signed)
Taking metformin 500 mg daily. Last HgA1c 6.6. Recheck today. Informed that just having diabetes does not qualify for FMLA. Checking HgA1c, lipid panel. If no change to LDL would need statin (last LDL 150s). Goal LDL<100.

## 2016-11-16 NOTE — Assessment & Plan Note (Signed)
Checking lipid panel and goal LDL <100.

## 2016-11-24 ENCOUNTER — Ambulatory Visit: Payer: 59 | Admitting: Internal Medicine

## 2016-12-15 ENCOUNTER — Ambulatory Visit: Payer: 59 | Admitting: Certified Nurse Midwife

## 2017-01-25 ENCOUNTER — Other Ambulatory Visit: Payer: Self-pay | Admitting: Internal Medicine

## 2017-01-25 DIAGNOSIS — E119 Type 2 diabetes mellitus without complications: Secondary | ICD-10-CM

## 2017-01-25 MED ORDER — METFORMIN HCL ER 500 MG PO TB24: 500.0000 mg | ORAL_TABLET | Freq: Every day | ORAL | 1 refills | Status: DC

## 2017-01-25 NOTE — Telephone Encounter (Signed)
Medication refilled and sent to requested pharmacy

## 2017-01-25 NOTE — Telephone Encounter (Signed)
Copied from CRM [id number]. Topic: General - Other >> Jan 25, 2017  8:09 AM Cecelia ByarsGreen, Huy Majid L, RMA wrote: Reason for CRM: Medication refill request for Metformin 500 mg to be sent to [facility] [address]

## 2017-05-02 ENCOUNTER — Ambulatory Visit (INDEPENDENT_AMBULATORY_CARE_PROVIDER_SITE_OTHER): Payer: 59 | Admitting: Obstetrics and Gynecology

## 2017-05-02 ENCOUNTER — Encounter: Payer: Self-pay | Admitting: Obstetrics and Gynecology

## 2017-05-02 ENCOUNTER — Other Ambulatory Visit (HOSPITAL_COMMUNITY)
Admission: RE | Admit: 2017-05-02 | Discharge: 2017-05-02 | Disposition: A | Discharge status: Home / Self Care | Payer: 59 | Source: Ambulatory Visit | Attending: Obstetrics and Gynecology | Admitting: Obstetrics and Gynecology

## 2017-05-02 VITALS — BP 134/83 | HR 87 | Wt 157.3 lb

## 2017-05-02 DIAGNOSIS — Z113 Encounter for screening for infections with a predominantly sexual mode of transmission: Secondary | ICD-10-CM

## 2017-05-02 NOTE — Progress Notes (Signed)
   GYNECOLOGY OFFICE VISIT NOTE  History:  32 y.o. [id number] here today for STI screen. She is concerned her partner has been unfaithful and would like to be tested. She denies any abnormal vaginal discharge, bleeding, pelvic pain or other concerns. Currently using Nexplanon for contraception and happy with it.  Past Medical History:  Diagnosis Date  . Allergic disorder   . Bartholin cyst   . Gestational diabetes    2015  . Headache(784.0)   . Irregular menstrual cycle   . Pregnancy induced hypertension    with pregnancy in 2005  . Sickle cell trait (HCC)   . SROM (spontaneous rupture of membranes) 05/11/2011  . Urinary tract infection     Past Surgical History:  Procedure Laterality Date  . barthilon cyst    . THERAPEUTIC ABORTION       Current Outpatient Medications:  .  Etonogestrel (NEXPLANON [place]), Inject into the skin., Disp: , Rfl:  .  ibuprofen (ADVIL,MOTRIN) 800 MG tablet, Take 1 tablet (800 mg total) by mouth 3 (three) times daily., Disp: 21 tablet, Rfl: 0 .  metFORMIN (GLUCOPHAGE-XR) 500 MG 24 hr tablet, Take 1 tablet (500 mg total) by mouth daily with breakfast., Disp: 90 tablet, Rfl: 1  The following portions of the patient's history were reviewed and updated as appropriate: allergies, current medications, past family history, past medical history, past social history, past surgical history and problem list.   Review of Systems:  Pertinent items noted in HPI and remainder of comprehensive ROS otherwise negative.   Objective:  Physical Exam BP 134/83 (BP Location: Right Arm, Cuff Size: Normal)   Pulse 87   Wt 157 lb 4.8 oz (71.4 kg)   LMP  (LMP Unknown)   BMI 27.00 kg/m  CONSTITUTIONAL: Well-developed, well-nourished female in no acute distress.  HENT:  Normocephalic, atraumatic. External right and left ear normal. Oropharynx is clear and moist EYES: Conjunctivae and EOM are normal. Pupils are equal, round, and reactive to light. No scleral icterus.  NECK:  Normal range of motion, supple, no masses SKIN: Skin is warm and dry. No rash noted. Not diaphoretic. No erythema. No pallor. NEUROLOGIC: Alert and oriented to person, place, and time. Normal reflexes, muscle tone coordination. No cranial nerve deficit noted. PSYCHIATRIC: Normal mood and affect. Normal behavior. Normal judgment and thought content. CARDIOVASCULAR: Normal heart rate noted RESPIRATORY: Effort and breath sounds normal, no problems with respiration noted ABDOMEN: Soft, no distention noted.   PELVIC: normal appearing external female genitalia, normal appearing cervix and vaginal mucosa, scant thin clear discharge at os MUSCULOSKELETAL: Normal range of motion. No edema noted.  Labs and Imaging No results found.  Assessment & Plan:  1. Routine screening for STI (sexually transmitted infection) - Cervicovaginal ancillary only - HIV antibody (with reflex) - RPR - Hepatitis C Antibody - Hepatitis B surface antigen   Routine preventative health maintenance measures emphasized. Please refer to After Visit Summary for other counseling recommendations.   Return in about 3 months (around 08/02/2017) for annual.    Baldemar LenisK. Meryl Davis, M.D. Attending Obstetrician & Gynecologist, [facility] for [facility], [facility]

## 2017-05-02 NOTE — Progress Notes (Signed)
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  Denies discharge, lower abdominal pain or odor.

## 2017-05-03 LAB — HIV ANTIBODY (ROUTINE TESTING W REFLEX): HIV SCREEN 4TH GENERATION: NONREACTIVE

## 2017-05-03 LAB — CERVICOVAGINAL ANCILLARY ONLY
Chlamydia: NEGATIVE
NEISSERIA GONORRHEA: NEGATIVE
TRICH (WINDOWPATH): NEGATIVE

## 2017-05-03 LAB — RPR: RPR: NONREACTIVE

## 2017-05-03 LAB — HEPATITIS B SURFACE ANTIGEN: Hepatitis B Surface Ag: NEGATIVE

## 2017-05-03 LAB — HEPATITIS C ANTIBODY

## 2017-06-22 ENCOUNTER — Other Ambulatory Visit: Payer: Self-pay | Admitting: Internal Medicine

## 2017-06-22 DIAGNOSIS — E119 Type 2 diabetes mellitus without complications: Secondary | ICD-10-CM

## 2017-09-18 ENCOUNTER — Ambulatory Visit: Payer: 59 | Admitting: Obstetrics

## 2018-03-14 IMAGING — DX DG HAND COMPLETE 3+V*R*
3 series · 3 of 3 positions shown · non-contrast
Comparison: None.

CLINICAL DATA: 31-year-old female with right hand injury sustained
at work

EXAM:
RIGHT HAND - COMPLETE 3+ VIEW

[hand pa]
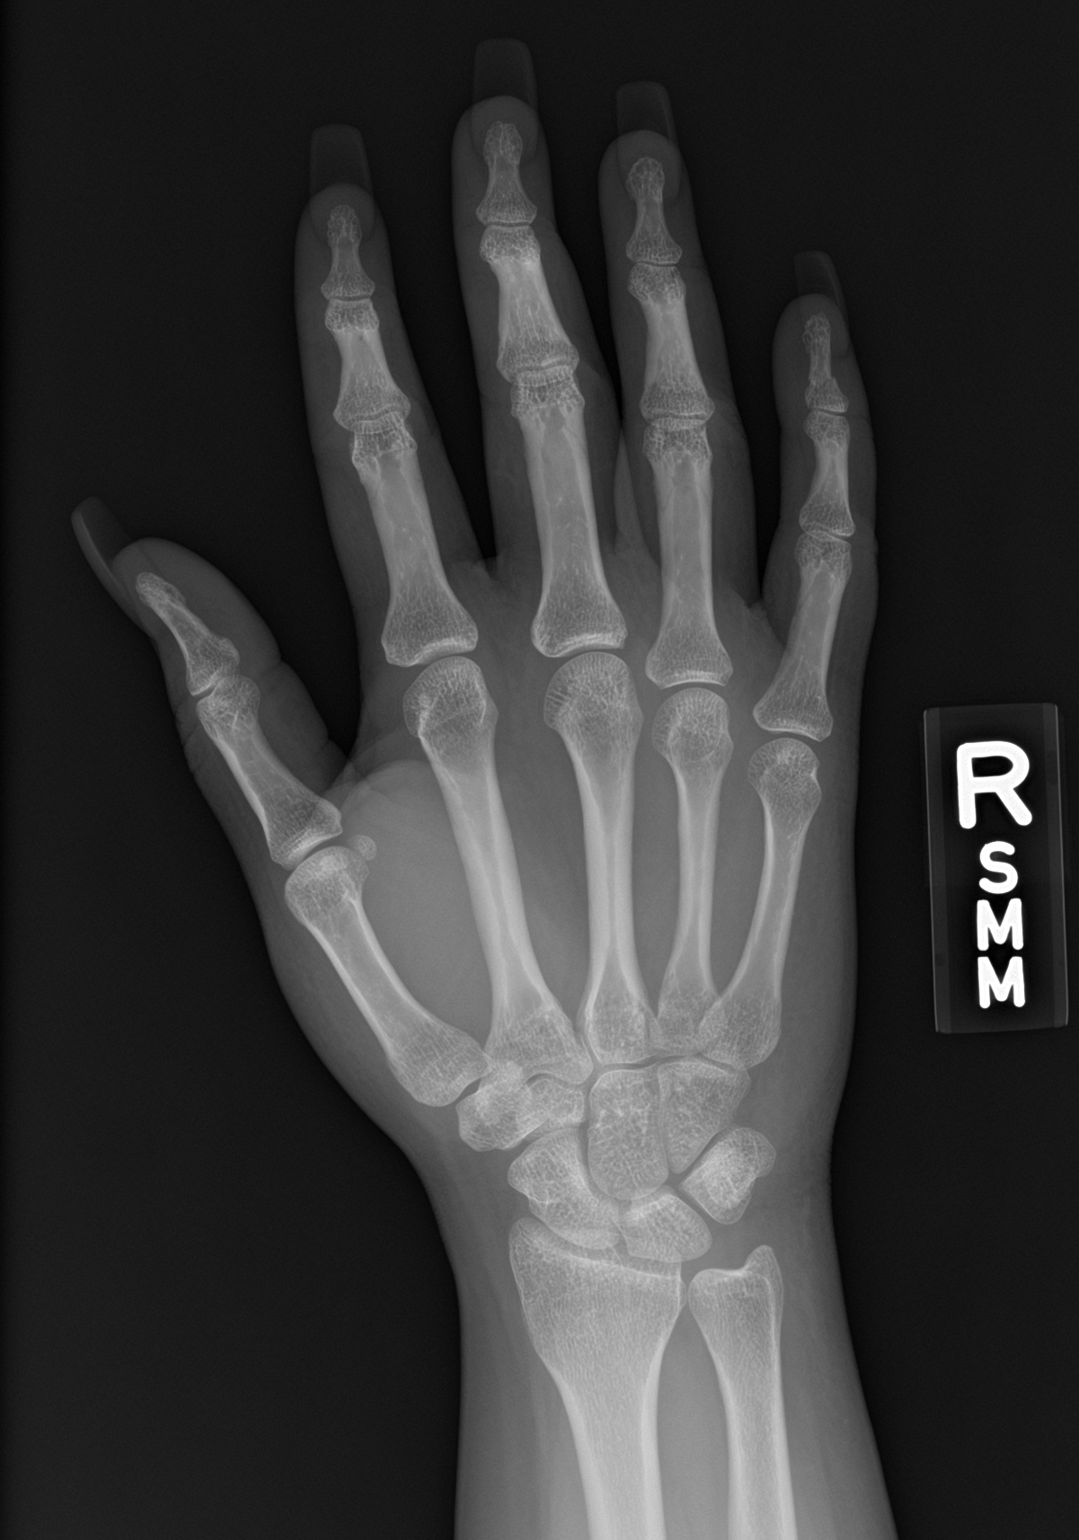

[hand obl]
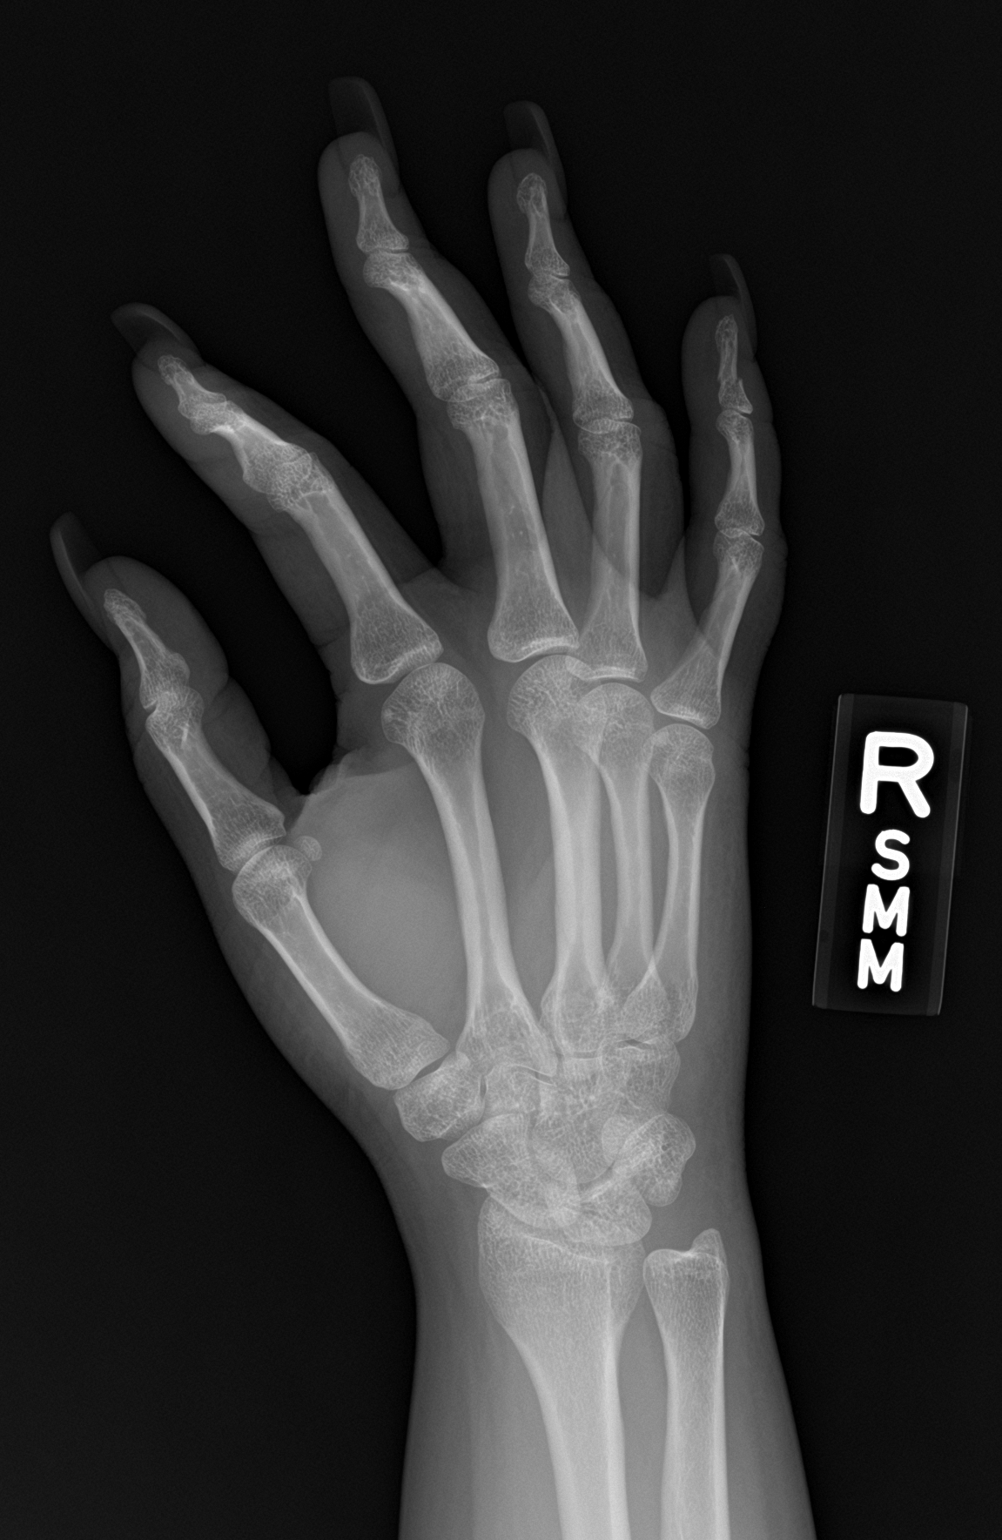

[hand lat]
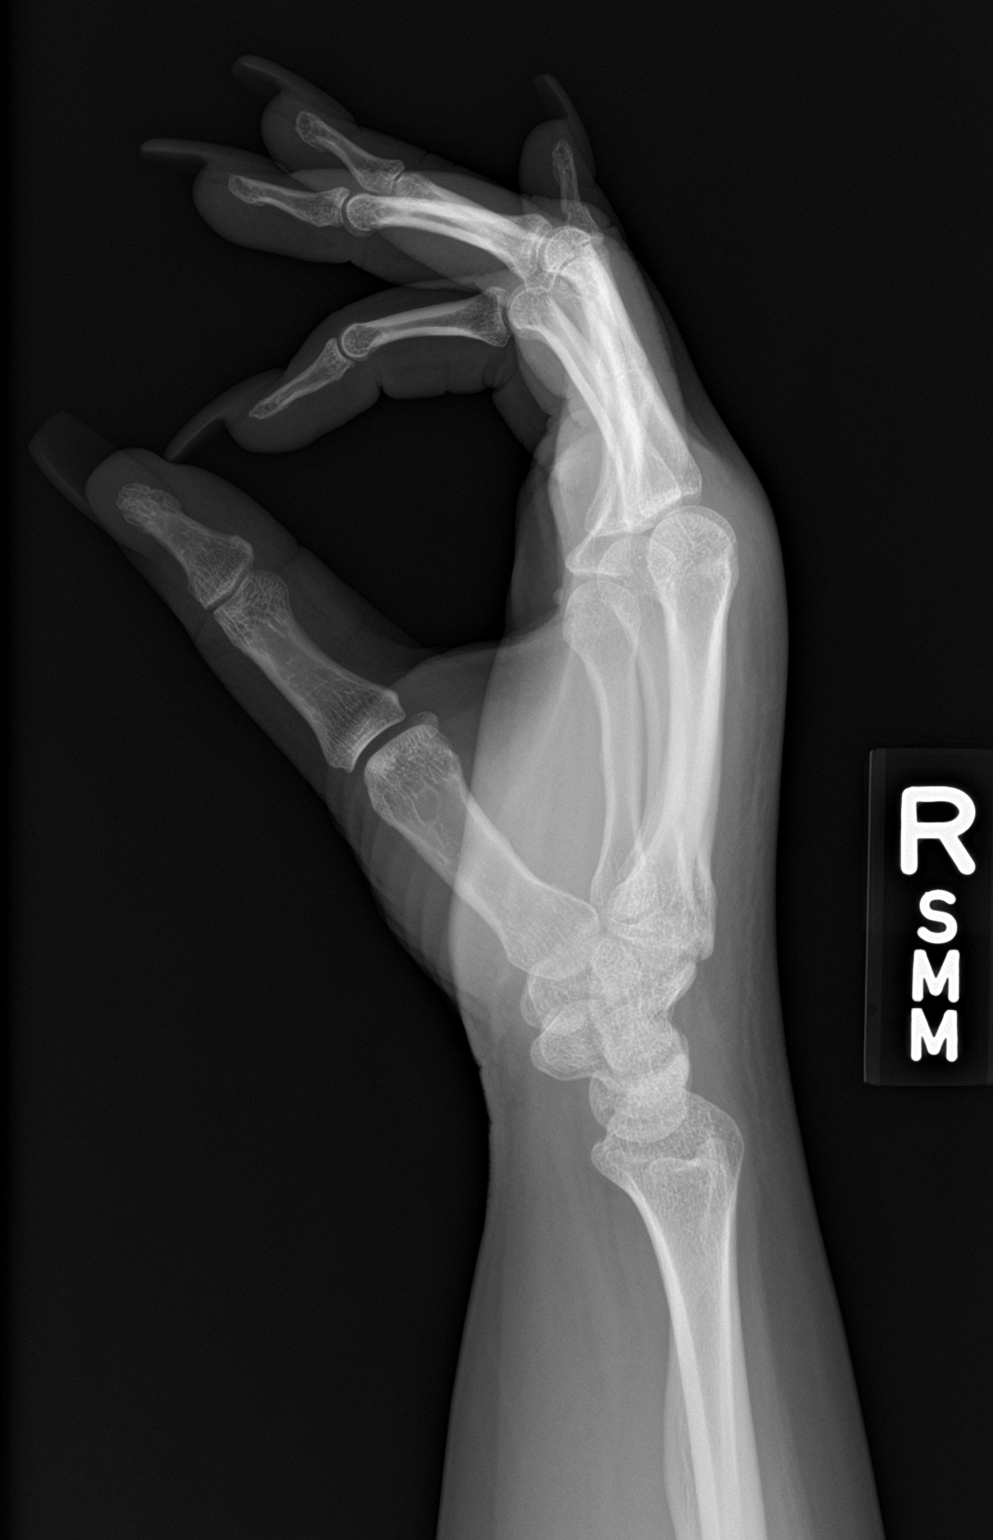

[3 of 3 positions shown; findings below may reference images not displayed]

FINDINGS: There is no evidence of fracture or dislocation. There is no
evidence of arthropathy or other focal bone abnormality. Mild soft
tissue swelling over the dorsum of the hand.
IMPRESSION: Soft tissue swelling over the dorsal aspect of the metacarpals
without evidence of underlying fracture.

## 2018-08-30 ENCOUNTER — Ambulatory Visit (INDEPENDENT_AMBULATORY_CARE_PROVIDER_SITE_OTHER): Payer: 59 | Admitting: Obstetrics and Gynecology

## 2018-08-30 ENCOUNTER — Other Ambulatory Visit: Payer: Self-pay

## 2018-08-30 ENCOUNTER — Encounter: Payer: Self-pay | Admitting: Obstetrics and Gynecology

## 2018-08-30 DIAGNOSIS — Z30017 Encounter for initial prescription of implantable subdermal contraceptive: Secondary | ICD-10-CM

## 2018-08-30 DIAGNOSIS — Z3046 Encounter for surveillance of implantable subdermal contraceptive: Secondary | ICD-10-CM

## 2018-08-30 MED ORDER — ETONOGESTREL 68 MG ~~LOC~~ IMPL
68.0000 mg | DRUG_IMPLANT | Freq: Once | SUBCUTANEOUS | Status: AC
  Administered 2018-08-30: 68 mg via SUBCUTANEOUS

## 2018-08-30 NOTE — Progress Notes (Signed)
    GYNECOLOGY OFFICE PROCEDURE NOTE  Lauren [name] is a 33 y.o. [phone] here for Nexplanon removal & Nexplanon insertion.  Last pap smear was on 07/2016 and was normal.  No other gynecologic concerns.  Reviewed risks of removal and insertion of implant including risk of infection, bleeding, damage to surrounding tissues and organs, migration of implant, difficult removal, inability to remove in office. She verbalizes understanding and affirms desire to proceed. Consent signed. She is very happy with Nexplanon.   Nexplanon Removal and Insertion  Patient does understand that irregular bleeding is a very common side effect of this medication.  Appropriate time out taken. Nexplanon site identified. Area prepped in usual sterile fashon. Two ml of 1% lidocaine was used to anesthetize the area at the distal end of the implant. A small stab incision was made right beside the implant on the distal portion. The Nexplanon rod was grasped using hemostats and removed without difficulty. There was minimal blood loss. There were no complications. She was re-prepped with betadine, Nexplanon removed from packaging, device confirmed in needle, then inserted full length of needle and withdrawn per handbook instructions. Nexplanon was able to palpated in the patient's arm; patient palpated the insert herself.  There was minimal blood loss. Patient insertion site covered with gauze and a pressure bandage to reduce any bruising. The patient tolerated the procedure well and was given post procedure instructions.      Device Info Exp: [id number] Lot#: [id number]  K. Arvilla Meres, M.D. Attending Center for [employer])

## 2018-08-30 NOTE — Progress Notes (Signed)
Patient presents for Annual Exam today.  Last pap: 08/11/2016 : WNL Contraception: Nexplanon inserted 09/03/15 left arm  Scheduled for Removal reinsertion today.  LMP:No cycles with Nexplanon    CC: NONE

## 2018-12-16 HISTORY — PX: COLONOSCOPY WITH ESOPHAGOGASTRODUODENOSCOPY (EGD): SHX5779

## 2019-02-15 ENCOUNTER — Other Ambulatory Visit: Payer: Self-pay

## 2019-02-15 ENCOUNTER — Encounter (HOSPITAL_COMMUNITY): Payer: Self-pay

## 2019-02-15 ENCOUNTER — Emergency Department (HOSPITAL_COMMUNITY): Payer: No Typology Code available for payment source

## 2019-02-15 ENCOUNTER — Emergency Department (HOSPITAL_COMMUNITY)
Admission: EM | Admit: 2019-02-15 | Discharge: 2019-02-15 | Disposition: A | Discharge status: Home / Self Care | Payer: No Typology Code available for payment source | Attending: Emergency Medicine | Admitting: Emergency Medicine

## 2019-02-15 ENCOUNTER — Ambulatory Visit (INDEPENDENT_AMBULATORY_CARE_PROVIDER_SITE_OTHER)
Admission: EM | Admit: 2019-02-15 | Discharge: 2019-02-15 | Disposition: A | Discharge status: Home / Self Care | Payer: No Typology Code available for payment source | Source: Home / Self Care

## 2019-02-15 ENCOUNTER — Encounter (HOSPITAL_COMMUNITY): Payer: Self-pay | Admitting: Emergency Medicine

## 2019-02-15 DIAGNOSIS — E119 Type 2 diabetes mellitus without complications: Secondary | ICD-10-CM | POA: Insufficient documentation

## 2019-02-15 DIAGNOSIS — J069 Acute upper respiratory infection, unspecified: Secondary | ICD-10-CM

## 2019-02-15 DIAGNOSIS — J189 Pneumonia, unspecified organism: Secondary | ICD-10-CM | POA: Diagnosis not present

## 2019-02-15 DIAGNOSIS — R350 Frequency of micturition: Secondary | ICD-10-CM | POA: Diagnosis not present

## 2019-02-15 DIAGNOSIS — Z20822 Contact with and (suspected) exposure to covid-19: Secondary | ICD-10-CM | POA: Insufficient documentation

## 2019-02-15 DIAGNOSIS — R071 Chest pain on breathing: Secondary | ICD-10-CM | POA: Diagnosis not present

## 2019-02-15 DIAGNOSIS — R0600 Dyspnea, unspecified: Secondary | ICD-10-CM | POA: Insufficient documentation

## 2019-02-15 DIAGNOSIS — Z7984 Long term (current) use of oral hypoglycemic drugs: Secondary | ICD-10-CM | POA: Insufficient documentation

## 2019-02-15 DIAGNOSIS — R0602 Shortness of breath: Secondary | ICD-10-CM

## 2019-02-15 DIAGNOSIS — Z79899 Other long term (current) drug therapy: Secondary | ICD-10-CM | POA: Diagnosis not present

## 2019-02-15 LAB — URINALYSIS, ROUTINE W REFLEX MICROSCOPIC
Bilirubin Urine: NEGATIVE
Glucose, UA: 50 mg/dL — AB
Hgb urine dipstick: NEGATIVE
Ketones, ur: NEGATIVE mg/dL
Leukocytes,Ua: NEGATIVE
Nitrite: NEGATIVE
Protein, ur: NEGATIVE mg/dL
Specific Gravity, Urine: 1.04 — ABNORMAL HIGH (ref 1.005–1.030)
pH: 6 (ref 5.0–8.0)

## 2019-02-15 LAB — HEPATIC FUNCTION PANEL
ALT: 80 U/L — ABNORMAL HIGH (ref 0–44)
AST: 58 U/L — ABNORMAL HIGH (ref 15–41)
Albumin: 3.6 g/dL (ref 3.5–5.0)
Alkaline Phosphatase: 95 U/L (ref 38–126)
Bilirubin, Direct: 0.2 mg/dL (ref 0.0–0.2)
Indirect Bilirubin: 0.4 mg/dL (ref 0.3–0.9)
Total Bilirubin: 0.6 mg/dL (ref 0.3–1.2)
Total Protein: 7.6 g/dL (ref 6.5–8.1)

## 2019-02-15 LAB — BASIC METABOLIC PANEL
Anion gap: 15 (ref 5–15)
BUN: 6 mg/dL (ref 6–20)
CO2: 22 mmol/L (ref 22–32)
Calcium: 8.5 mg/dL — ABNORMAL LOW (ref 8.9–10.3)
Chloride: 100 mmol/L (ref 98–111)
Creatinine, Ser: 0.86 mg/dL (ref 0.44–1.00)
GFR calc Af Amer: >60 mL/min (ref >60)
GFR calc non Af Amer: >60 mL/min (ref >60)
Glucose, Bld: 190 mg/dL — ABNORMAL HIGH (ref 70–99)
Potassium: 3.1 mmol/L — ABNORMAL LOW (ref 3.5–5.1)
Sodium: 137 mmol/L (ref 135–145)

## 2019-02-15 LAB — TROPONIN I (HIGH SENSITIVITY): Troponin I (High Sensitivity): 5 ng/L (ref <18)

## 2019-02-15 LAB — CBC
HCT: 41.6 % (ref 36.0–46.0)
Hemoglobin: 13.8 g/dL (ref 12.0–15.0)
MCH: 29 pg (ref 26.0–34.0)
MCHC: 33.2 g/dL (ref 30.0–36.0)
MCV: 87.4 fL (ref 80.0–100.0)
Platelets: 202 10*3/uL (ref 150–400)
RBC: 4.76 MIL/uL (ref 3.87–5.11)
RDW: 12.9 % (ref 11.5–15.5)
WBC: 5.9 10*3/uL (ref 4.0–10.5)
nRBC: 0 % (ref 0.0–0.2)

## 2019-02-15 LAB — LIPASE, BLOOD: Lipase: 25 U/L (ref 11–51)

## 2019-02-15 LAB — I-STAT BETA HCG BLOOD, ED (MC, WL, AP ONLY): I-stat hCG, quantitative: <5 m[IU]/mL (ref <5)

## 2019-02-15 LAB — POC SARS CORONAVIRUS 2 AG -  ED: SARS Coronavirus 2 Ag: NEGATIVE

## 2019-02-15 MED ORDER — DOXYCYCLINE HYCLATE 100 MG PO CAPS: 100.0000 mg | ORAL_CAPSULE | Freq: Two times a day (BID) | ORAL | 0 refills | Status: AC

## 2019-02-15 MED ORDER — POTASSIUM CHLORIDE CRYS ER 20 MEQ PO TBCR
40.0000 meq | EXTENDED_RELEASE_TABLET | Freq: Once | ORAL | Status: AC
  Administered 2019-02-15: 20:00:00 40 meq via ORAL
  Filled 2019-02-15: qty 2

## 2019-02-15 MED ORDER — SODIUM CHLORIDE 0.9 % IV BOLUS
1000.0000 mL | Freq: Once | INTRAVENOUS | Status: AC
  Administered 2019-02-15: 20:00:00 1000 mL via INTRAVENOUS

## 2019-02-15 MED ORDER — IOHEXOL 350 MG/ML SOLN
100.0000 mL | Freq: Once | INTRAVENOUS | Status: AC | PRN
  Administered 2019-02-15: 100 mL via INTRAVENOUS

## 2019-02-15 MED ORDER — FLUCONAZOLE 150 MG PO TABS: 150.0000 mg | ORAL_TABLET | Freq: Once | ORAL | 0 refills | Status: AC

## 2019-02-15 NOTE — ED Provider Notes (Signed)
[facility]    CSN: [id number] Arrival date & time: 02/15/19  1618      History   Chief Complaint Chief Complaint  Patient presents with  . Shortness of Breath    HPI Lauren [name] is a 34 y.o. female.   Patient reports to urgent care today for 2 weeks of shortness of breath, dry mouth and now chest pain. She reports she has been constantly short of breath for 2 weeks. She also reports having central chest pain that is significantly worse with inspiration. She denies cough or hemoptysis. She reports bilateral feet swelling 1 week ago but this has subsided. She reports occasional upper respiratory symptoms and diarrhea 2 days ago within the last 2 weeks. She reports episodes of being flushed and feeling very hot and sweaty.   She reports a constantly dry mouth regardless of amount of fluid intake. She is no longer having periods due to nexplanon. Has not had blood in her stool. She reports light headedness with standing.   Patient is diabetic but does not have a history of DVT or PE.      Past Medical History:  Diagnosis Date  . Allergic disorder   . Bartholin cyst   . Gestational diabetes    2015  . Headache(784.0)   . Irregular menstrual cycle   . Pregnancy induced hypertension    with pregnancy in 2005  . Sickle cell trait ([place])   . SROM (spontaneous rupture of membranes) 05/11/2011  . Urinary tract infection     Patient Active Problem List   Diagnosis Date Noted  . Type 2 diabetes mellitus without complication, without long-term current use of insulin ([place]) 08/23/2016  . Mixed hyperlipidemia 08/23/2016  . Migraine 09/26/2014  . Overweight 05/19/2014    Past Surgical History:  Procedure Laterality Date  . barthilon cyst    . THERAPEUTIC ABORTION      OB History    Gravida  4   Para  3   Term  2   Preterm  1   AB  1   Living  3     SAB  0   TAB  1   Ectopic  0   Multiple  0   Live Births  3            Home  Medications    Prior to Admission medications   Medication Sig Start Date End Date Taking? Authorizing Provider  Etonogestrel (NEXPLANON [place]) Inject into the skin.    [provider]  ibuprofen (ADVIL,MOTRIN) 800 MG tablet Take 1 tablet (800 mg total) by mouth 3 (three) times daily. 09/03/16   Jacqualine Mau, NP  metFORMIN (GLUCOPHAGE-XR) 500 MG 24 hr tablet TAKE 1 TABLET BY MOUTH EVERY DAY WITH BREAKFAST 06/23/17   Hoyt Koch, MD    Family History Family History  Problem Relation Age of Onset  . Asthma Father   . Heart failure Father   . Diabetes Sister   . Hyperlipidemia Sister   . Hyperlipidemia Sister   . Hyperlipidemia Sister   . Hyperlipidemia Sister   . Diabetes Sister   . Diabetes Sister   . Cancer Sister   . Lupus Maternal Grandmother   . Alcohol abuse Neg Hx   . Arthritis Neg Hx   . Birth defects Neg Hx   . COPD Neg Hx   . Depression Neg Hx   . Drug abuse Neg Hx   . Early death Neg Hx   .  Hearing loss Neg Hx   . Heart disease Neg Hx   . Hypertension Neg Hx   . Kidney disease Neg Hx   . Learning disabilities Neg Hx   . Mental illness Neg Hx   . Mental retardation Neg Hx   . Miscarriages / Stillbirths Neg Hx   . Stroke Neg Hx   . Vision loss Neg Hx     Social History Social History   Tobacco Use  . Smoking status: Never Smoker  . Smokeless tobacco: Never Used  Substance Use Topics  . Alcohol use: Yes    Alcohol/week: 0.0 standard drinks    Comment: occasional  . Drug use: No     Allergies   Patient has no known allergies.   Review of Systems Review of Systems  Constitutional: Positive for diaphoresis. Negative for chills and fever.  HENT: Negative for ear pain and sore throat.   Eyes: Negative for pain and visual disturbance.  Respiratory: Positive for shortness of breath. Negative for cough.   Cardiovascular: Positive for chest pain. Negative for palpitations.  Gastrointestinal: Positive for diarrhea. Negative for  abdominal pain, blood in stool and vomiting.  Genitourinary: Negative for dysuria and hematuria.  Musculoskeletal: Negative for arthralgias, back pain and myalgias.  Skin: Negative for color change and rash.  Neurological: Positive for headaches. Negative for seizures and syncope.  All other systems reviewed and are negative.    Physical Exam Triage Vital Signs ED Triage Vitals  Enc Vitals Group     BP 02/15/19 1653 125/87     Pulse Rate 02/15/19 1653 (!) 132     Resp 02/15/19 1653 18     Temp 02/15/19 1653 98.4 F (36.9 C)     Temp Source 02/15/19 1653 Oral     SpO2 02/15/19 1653 98 %     Weight --      Height --      Head Circumference --      Peak Flow --      Pain Score 02/15/19 1652 0     Pain Loc --      Pain Edu? --      Excl. in GC? --    No data found.  Updated Vital Signs BP 125/87 (BP Location: Left Arm)   Pulse (!) 132   Temp 98.4 F (36.9 C) (Oral)   Resp 18   SpO2 98%   Visual Acuity Right Eye Distance:   Left Eye Distance:   Bilateral Distance:    Right Eye Near:   Left Eye Near:    Bilateral Near:     Physical Exam Vitals and nursing note reviewed.  Constitutional:      General: She is in acute distress.     Appearance: She is well-developed and normal weight. She is ill-appearing.  HENT:     Head: Normocephalic and atraumatic.     Mouth/Throat:     Mouth: Mucous membranes are dry.     Comments: Tongue pale with atrophic appearance  Eyes:     Conjunctiva/sclera: Conjunctivae normal.     Pupils: Pupils are equal, round, and reactive to light.  Cardiovascular:     Rate and Rhythm: Regular rhythm. Tachycardia present.     Heart sounds: No murmur.  Pulmonary:     Effort: Respiratory distress present.     Breath sounds: Normal breath sounds.  Chest:     Chest wall: No tenderness.  Abdominal:     Palpations: Abdomen is soft.  Tenderness: There is no abdominal tenderness.  Musculoskeletal:     Cervical back: Normal range of motion  and neck supple.     Right lower leg: No edema.     Left lower leg: No edema.  Skin:    General: Skin is warm and dry.     Coloration: Skin is pale (mucous membranes with pallor).  Neurological:     General: No focal deficit present.     Mental Status: She is alert and oriented to person, place, and time.  Psychiatric:        Mood and Affect: Mood normal.        Behavior: Behavior normal.      UC Treatments / Results  Labs (all labs ordered are listed, but only abnormal results are displayed) Labs Reviewed  NOVEL CORONAVIRUS, NAA (HOSP ORDER, SEND-OUT TO REF LAB; TAT 18-24 HRS)    EKG Sinus Tachycardia with borderline Right axis deviation. No ST Elevation  Radiology No results found.  Procedures Procedures (including critical care time)  Medications Ordered in UC Medications - No data to display  Initial Impression / Assessment and Plan / UC Course  I have reviewed the triage vital signs and the nursing notes.  Pertinent labs & imaging results that were available during my care of the patient were reviewed by me and considered in my medical decision making (see chart for details).     #chest pain #Shortnes of breath - Tachycadic to 130s with pleuritic chest pain. Concern for PE given shortness of breath, pleuritic pain and tachycardia. ECG with borderline right axis deviation. No history of DVT or PE but only other as likely diagnosis would be severe anemia or COVID-19 which would place her at a [place] 4pts and moderate risk of PE. Though no good reason for anemia given no menstrual cycles and no sign of GI bleed. Given this, decision was made to have patient report to Emergency Department. She was initially hesitant but staff and myself explained the need for her to have further evaluation and care that can not be given in Urgent Care. Patient agrees to report to the Emergency Department immediately after leaving Urgent Care.  COVID PCR was sent   Final Clinical  Impressions(s) / UC Diagnoses   Final diagnoses:  SOB (shortness of breath)  Chest pain on breathing     Discharge Instructions     Go to the Emergency Department to be further evaluated.     ED Prescriptions    None     PDMP not reviewed this encounter.   Hermelinda Medicus, PA-C 02/15/19 1742

## 2019-02-15 NOTE — Discharge Instructions (Signed)
Your work-up today was concerning for either COVID-19 viral infection versus a bacterial pneumonia on your imaging.  The blood clot imaging was negative.  Your heart rate improved with fluids.  We suspect you are dehydrated with your dry mouth.  Please maintain hydration at home and rest.  Please fill your prescriptions of antibiotics if your Covid test is negative tomorrow.  If your Covid test is positive, you may not fill the prescription and follow-up with your primary doctor and rest.  If any symptoms change or worsen, please return to the nearest emergency department.

## 2019-02-15 NOTE — ED Notes (Signed)
Patient verbalizes understanding of discharge instructions. Opportunity for questioning and answers were provided. Armband removed by staff, pt discharged from ED ambulatory.   

## 2019-02-15 NOTE — ED Provider Notes (Signed)
[facility] EMERGENCY DEPARTMENT Provider Note   CSN: [id number] Arrival date & time: 02/15/19  1741     History Chief Complaint  Patient presents with  . Shortness of Breath    Lauren [name] is a 34 y.o. female.  The history is provided by the patient and medical records. No language interpreter was used.  Chest Pain Pain location:  Substernal area and L chest Pain quality: pressure and sharp   Pain radiates to:  Does not radiate Pain severity:  Severe Onset quality:  Gradual Duration:  1 week Timing:  Constant Progression:  Waxing and waning Chronicity:  New Context: breathing   Worsened by:  Deep breathing and coughing Ineffective treatments:  None tried Associated symptoms: diaphoresis, fatigue, nausea and shortness of breath   Associated symptoms: no abdominal pain, no altered mental status, no back pain, no cough (minimal), no dizziness, no fever, no headache, no lower extremity edema, no near-syncope, no palpitations, no vomiting and no weakness   Risk factors: not female        Past Medical History:  Diagnosis Date  . Allergic disorder   . Bartholin cyst   . Gestational diabetes    2015  . Headache(784.0)   . Irregular menstrual cycle   . Pregnancy induced hypertension    with pregnancy in 2005  . Sickle cell trait (HCC)   . SROM (spontaneous rupture of membranes) 05/11/2011  . Urinary tract infection     Patient Active Problem List   Diagnosis Date Noted  . Type 2 diabetes mellitus without complication, without long-term current use of insulin (HCC) 08/23/2016  . Mixed hyperlipidemia 08/23/2016  . Migraine 09/26/2014  . Overweight 05/19/2014    Past Surgical History:  Procedure Laterality Date  . barthilon cyst    . THERAPEUTIC ABORTION       OB History    Gravida  4   Para  3   Term  2   Preterm  1   AB  1   Living  3     SAB  0   TAB  1   Ectopic  0   Multiple  0   Live Births  3            Family History  Problem Relation Age of Onset  . Asthma Father   . Heart failure Father   . Diabetes Sister   . Hyperlipidemia Sister   . Hyperlipidemia Sister   . Hyperlipidemia Sister   . Hyperlipidemia Sister   . Diabetes Sister   . Diabetes Sister   . Cancer Sister   . Lupus Maternal Grandmother   . Alcohol abuse Neg Hx   . Arthritis Neg Hx   . Birth defects Neg Hx   . COPD Neg Hx   . Depression Neg Hx   . Drug abuse Neg Hx   . Early death Neg Hx   . Hearing loss Neg Hx   . Heart disease Neg Hx   . Hypertension Neg Hx   . Kidney disease Neg Hx   . Learning disabilities Neg Hx   . Mental illness Neg Hx   . Mental retardation Neg Hx   . Miscarriages / Stillbirths Neg Hx   . Stroke Neg Hx   . Vision loss Neg Hx     Social History   Tobacco Use  . Smoking status: Never Smoker  . Smokeless tobacco: Never Used  Substance Use Topics  . Alcohol use: Yes  Alcohol/week: 0.0 standard drinks    Comment: occasional  . Drug use: No    Home Medications Prior to Admission medications   Medication Sig Start Date End Date Taking? Authorizing Provider  Etonogestrel (NEXPLANON [place]) Inject into the skin.    [provider]  ibuprofen (ADVIL,MOTRIN) 800 MG tablet Take 1 tablet (800 mg total) by mouth 3 (three) times daily. 09/03/16   Jacqualine Mau, NP  metFORMIN (GLUCOPHAGE-XR) 500 MG 24 hr tablet TAKE 1 TABLET BY MOUTH EVERY DAY WITH BREAKFAST 06/23/17   Hoyt Koch, MD    Allergies    Patient has no known allergies.  Review of Systems   Review of Systems  Constitutional: Positive for diaphoresis and fatigue. Negative for chills and fever.  HENT: Negative for congestion.   Eyes: Negative for visual disturbance.  Respiratory: Positive for chest tightness and shortness of breath. Negative for cough (minimal), wheezing and stridor.   Cardiovascular: Positive for chest pain. Negative for palpitations, leg swelling and near-syncope.   Gastrointestinal: Positive for nausea. Negative for abdominal pain, constipation, diarrhea and vomiting.  Genitourinary: Positive for frequency. Negative for dysuria and flank pain.  Musculoskeletal: Negative for back pain, neck pain and neck stiffness.  Skin: Negative for rash.  Neurological: Positive for light-headedness. Negative for dizziness, weakness and headaches.  Psychiatric/Behavioral: Negative for agitation.  All other systems reviewed and are negative.   Physical Exam Updated Vital Signs BP (!) 135/95 (BP Location: Right Arm)   Pulse (!) 122   Temp 98.2 F (36.8 C) (Oral)   Resp 18   SpO2 100%   Physical Exam Vitals and nursing note reviewed.  Constitutional:      General: She is not in acute distress.    Appearance: She is well-developed. She is not ill-appearing, toxic-appearing or diaphoretic.  HENT:     Head: Normocephalic and atraumatic.     Right Ear: External ear normal.     Left Ear: External ear normal.     Nose: Nose normal.  Eyes:     Conjunctiva/sclera: Conjunctivae normal.     Pupils: Pupils are equal, round, and reactive to light.  Cardiovascular:     Rate and Rhythm: Regular rhythm. Tachycardia present.     Heart sounds: No murmur.  Pulmonary:     Effort: Pulmonary effort is normal. No tachypnea or respiratory distress.     Breath sounds: Normal breath sounds. No stridor. No wheezing, rhonchi or rales.  Chest:     Chest wall: No tenderness.  Abdominal:     General: There is no distension.     Tenderness: There is no abdominal tenderness. There is no rebound.  Musculoskeletal:     Cervical back: Normal range of motion and neck supple.     Right lower leg: No tenderness. No edema.     Left lower leg: No tenderness. No edema.  Skin:    General: Skin is warm.     Capillary Refill: Capillary refill takes less than 2 seconds.     Findings: No erythema or rash.  Neurological:     General: No focal deficit present.     Mental Status: She is  alert.     Motor: No abnormal muscle tone.     Coordination: Coordination normal.     Deep Tendon Reflexes: Reflexes are normal and symmetric.  Psychiatric:        Mood and Affect: Mood normal.     ED Results / Procedures / Treatments   Labs (all  labs ordered are listed, but only abnormal results are displayed) Labs Reviewed  BASIC METABOLIC PANEL - Abnormal; Notable for the following components:      Result Value   Potassium 3.1 (*)    Glucose, Bld 190 (*)    Calcium 8.5 (*)    All other components within normal limits  HEPATIC FUNCTION PANEL - Abnormal; Notable for the following components:   AST 58 (*)    ALT 80 (*)    All other components within normal limits  URINALYSIS, ROUTINE W REFLEX MICROSCOPIC - Abnormal; Notable for the following components:   Specific Gravity, Urine 1.040 (*)    Glucose, UA 50 (*)    All other components within normal limits  URINE CULTURE  CBC  LIPASE, BLOOD  POC SARS CORONAVIRUS 2 AG -  ED  I-STAT BETA HCG BLOOD, ED ([facility], [facility], [facility] ONLY)  TROPONIN I (HIGH SENSITIVITY)  TROPONIN I (HIGH SENSITIVITY)    EKG EKG Interpretation  Date/Time:  Friday February 15 2019 20:31:15 EST Ventricular Rate:  121 PR Interval:    QRS Duration: 82 QT Interval:  321 QTC Calculation: 456 R Axis:   99 Text Interpretation: Sinus tachycardia Borderline right axis deviation Low voltage, precordial leads When compared to prior, sinus tachycardia. No STEMI Confirmed by Theda Belfast ([id number]) on 02/15/2019 8:35:23 PM   Radiology CT Angio Chest PE W and/or Wo Contrast  Result Date: 02/15/2019 CLINICAL DATA:  Shortness of breath and chest pain EXAM: CT ANGIOGRAPHY CHEST WITH CONTRAST TECHNIQUE: Multidetector CT imaging of the chest was performed using the standard protocol during bolus administration of intravenous contrast. Multiplanar CT image reconstructions and MIPs were obtained to evaluate the vascular anatomy. CONTRAST:  OMNIPAQUE IOHEXOL 350 MG/ML SOLN  COMPARISON:  Chest x-ray 02/15/2019 FINDINGS: Cardiovascular: Satisfactory opacification of the pulmonary arteries to the segmental level. No evidence of pulmonary embolism. Normal heart size. No pericardial effusion. Nonaneurysmal aorta. No dissection seen. Mediastinum/Nodes: No enlarged mediastinal, hilar, or axillary lymph nodes. Thyroid gland, trachea, and esophagus demonstrate no significant findings. Lungs/Pleura: No pleural effusion or pneumothorax. Scattered foci of consolidation and ground-glass density within the right greater than left lungs with more confluent consolidation in the right greater than left lung base. Upper Abdomen: Hepatic steatosis Musculoskeletal: No chest wall abnormality. No acute or significant osseous findings. Review of the MIP images confirms the above findings. IMPRESSION: 1. Negative for acute pulmonary embolus or aortic dissection. 2. Bilateral foci of ground-glass density and consolidation with more confluent disease at the right greater than left lung base, findings are suspicious for multifocal pneumonia, to include atypical or viral pneumonia. 3. Hepatic steatosis Electronically Signed   By: Jasmine Pang M.D.   On: 02/15/2019 20:24   DG Chest Portable 1 View  Result Date: 02/15/2019 CLINICAL DATA:  Shortness of breath EXAM: PORTABLE CHEST 1 VIEW COMPARISON:  03/25/2009 FINDINGS: The heart size and mediastinal contours are within normal limits. Subtle heterogeneous airspace opacities of the right greater than left lung bases. The visualized skeletal structures are unremarkable. IMPRESSION: Subtle heterogeneous airspace opacities of the right greater than left lung bases suspicious for infection. PA and lateral radiographs may be helpful to further evaluate. Electronically Signed   By: Lauralyn Primes M.D.   On: 02/15/2019 19:31    Procedures Procedures (including critical care time)  Medications Ordered in ED Medications  sodium chloride 0.9 % bolus 1,000 mL (0 mLs  Intravenous Stopped 02/15/19 2121)  potassium chloride SA (KLOR-CON) CR tablet 40 mEq (40  mEq Oral Given 02/15/19 1931)  iohexol (OMNIPAQUE) 350 MG/ML injection 100 mL (100 mLs Intravenous Contrast Given 02/15/19 2002)    ED Course  I have reviewed the triage vital signs and the nursing notes.  Pertinent labs & imaging results that were available during my care of the patient were reviewed by me and considered in my medical decision making (see chart for details).    MDM Rules/Calculators/A&P                      Theodoro GristChristine A Wonders is a 34 y.o. female with a past medical history significant for migraines, diabetes, hyperlipidemia, and sickle cell trait who presents from urgent care for chest pain, shortness of breath and tachycardia.  According to patient, over the last week he has been having worsening symptoms of shortness of breath.  She reports her chest discomfort is very pleuritic in nature and sharp and aching when she takes a deep breath.  It is in her central chest.  It does not radiate.  She reports some nausea but no vomiting.  She reports diarrhea several days ago that has improved.  She has not any fevers or chills during this but she reports her heart rate is very fast and she feels very dehydrated.  She reports that some urinary frequency and feels dry mouth.  She has no sick contacts and no known Covid contacts.  She went to urgent care today where she had a send out Covid test that is still pending but they sent her when her heart rate has been in the 130s and she is very short of breath.  She is not hypoxic however.  She actually denies significant cough.   On exam, chest is nontender and I cannot reproduce her discomfort.  Her breath sounds are clear and abdomen is nontender.  She is good pulses in all extremities.  No leg tenderness or leg swelling.  She denies any history of DVT or PE and has no history of traumatic injuries for her chest.  Exam otherwise unremarkable aside from heart  rate in the 130s.  EKG shows sinus tachycardia with no STEMI.  Clinically I am concerned about either Covid infection that was negative on the rapid test in the emergency department versus pulmonary embolism versus other.  Chest x-ray is ordered in triage but we will get a CT PE study as I am very concerned about pulmonary embolism with her tachycardia, shortness breath, pleuritic discomfort.  She will given fluids for the tachycardia.  Anticipate reassessment after work-up.  8:53 PM Patient CT PE study showed no pulmonary ballismus did show likely multifocal pneumonia either bacterial or viral in nature.  Patient is still receiving fluids but is still tachycardic in the 120s and is still very short of breath.  We will get orthostatics on her after the fluids and will also ambulate her with pulse ox.  If she gets hypoxic, will admit for a person under investigation as well as possible bacterial pneumonia.  If she has improvement in her after fluids, is able to ambulate safely without worsened vitals or hypoxia, would likely discharge with antibiotics for her to take if her Covid test is negative tomorrow.  Anticipate reassessment after the ambulation, orthostatics, and urine.  Patient does report she was having urinary frequency.  Nursing reports that her oxygen saturations remained above 93% on room air with ambulation.  Awaiting on urine and orthostatics.  10:00 PM Urinalysis shows no infection.  She  is feeling better after fluids.  Orthostatics were negative and she was having less symptoms.  Patient will be given prescription for doxycycline for pneumonia as well as Diflucan at her request for yeast infection prevention.  She will fill this prescription and take it if her Covid test returning tomorrow was negative.  If it is positive, she will not fill it and be treated for outpatient management of COVID-19 infection.  She understands return precautions and follow-up instructions.  Patient  discharged in good condition with improved symptoms.    Final Clinical Impression(s) / ED Diagnoses Final diagnoses:  Upper respiratory tract infection, unspecified type  Shortness of breath  Chest pain on breathing  Community acquired pneumonia, unspecified laterality  Person under investigation for COVID-19    Rx / DC Orders ED Discharge Orders         Ordered    fluconazole (DIFLUCAN) 150 MG tablet   Once     02/15/19 2158    doxycycline (VIBRAMYCIN) 100 MG capsule  2 times daily     02/15/19 2158          Clinical Impression: 1. Upper respiratory tract infection, unspecified type   2. Shortness of breath   3. Chest pain on breathing   4. Community acquired pneumonia, unspecified laterality   5. Person under investigation for COVID-19     Disposition: Discharge  Condition: Good  I have discussed the results, Dx and Tx plan with the pt(& family if present). He/she/they expressed understanding and agree(s) with the plan. Discharge instructions discussed at great length. Strict return precautions discussed and pt &/or family have verbalized understanding of the instructions. No further questions at time of discharge.    New Prescriptions   DOXYCYCLINE (VIBRAMYCIN) 100 MG CAPSULE    Take 1 capsule (100 mg total) by mouth 2 (two) times daily for 7 days.   FLUCONAZOLE (DIFLUCAN) 150 MG TABLET    Take 1 tablet (150 mg total) by mouth once for 1 dose.    Follow Up: Myrlene Broker, MD [address] [place] [address] [phone]     [facility] EMERGENCY DEPARTMENT [address] [id number] mc [place] [address] [phone]       Jewelz Kobus, Canary Brim, MD 02/15/19 2201

## 2019-02-15 NOTE — ED Triage Notes (Signed)
Pt presents to UC with SOB and dry mouth x 2 weeks aprox. Pt reports she is  SOB all the time.

## 2019-02-15 NOTE — ED Triage Notes (Signed)
Patient sent from [facility] for further evaluation. Patient c/o dry mouth and migraines since 12/24. Shortness of breath started 2 days ago. Decreased appetite. Had send out covid test done at [facility].

## 2019-02-15 NOTE — Discharge Instructions (Addendum)
Go to the Emergency Department to be further evaluated.

## 2019-02-15 NOTE — ED Notes (Signed)
Pt has been instructed to go to the ED.... pt verb understanding.

## 2019-02-15 NOTE — ED Notes (Signed)
Pt ambulated in room, SPO2 93-100% on RA. Pt HR 100-125

## 2019-02-17 LAB — URINE CULTURE

## 2019-02-18 LAB — NOVEL CORONAVIRUS, NAA (HOSP ORDER, SEND-OUT TO REF LAB; TAT 18-24 HRS): SARS-CoV-2, NAA: NOT DETECTED

## 2020-03-26 ENCOUNTER — Other Ambulatory Visit: Payer: Self-pay

## 2020-03-26 ENCOUNTER — Ambulatory Visit (HOSPITAL_COMMUNITY)
Admission: EM | Admit: 2020-03-26 | Discharge: 2020-03-26 | Disposition: A | Discharge status: Home / Self Care | Payer: Medicaid Other | Attending: Urgent Care | Admitting: Urgent Care

## 2020-03-26 ENCOUNTER — Encounter (HOSPITAL_COMMUNITY): Payer: Self-pay | Admitting: Emergency Medicine

## 2020-03-26 DIAGNOSIS — R739 Hyperglycemia, unspecified: Secondary | ICD-10-CM

## 2020-03-26 DIAGNOSIS — E1165 Type 2 diabetes mellitus with hyperglycemia: Secondary | ICD-10-CM

## 2020-03-26 LAB — CBG MONITORING, ED: Glucose-Capillary: 249 mg/dL — ABNORMAL HIGH (ref 70–99)

## 2020-03-26 NOTE — ED Provider Notes (Signed)
[facility]   MRN: [id number] DOB: 07-30-1985  Subjective:   Lauren [name] is a 35 y.o. female presenting for blood sugar check.  Patient is very anxious about her recently increased blood sugar.  She is working very closely with her PCP on this.  Had blood work done yesterday and an A1c of 10% was a result.  Her PCP has sent her message regarding starting Lantus but patient is unsure about this.  She is unable to tolerate Metformin very well.  She is on 200 mg once daily.  She just got started on Januvia as well. She is very hesitant with her medications as she is experienced side effects with Metformin does not know anything about insulin.  Denies fever, headache, confusion, chest pain, shortness of breath, belly pain, hematuria.  No current facility-administered medications for this encounter.  Current Outpatient Medications:  .  sitaGLIPtin (JANUVIA) 50 MG tablet, Take 50 mg by mouth daily. NEW MEDICINE TODAY, Disp: , Rfl:  .  Etonogestrel (NEXPLANON [place]), Inject into the skin., Disp: , Rfl:  .  ibuprofen (ADVIL,MOTRIN) 800 MG tablet, Take 1 tablet (800 mg total) by mouth 3 (three) times daily., Disp: 21 tablet, Rfl: 0 .  metFORMIN (GLUCOPHAGE-XR) 500 MG 24 hr tablet, TAKE 1 TABLET BY MOUTH EVERY DAY WITH BREAKFAST, Disp: 90 tablet, Rfl: 1   No Known Allergies  Past Medical History:  Diagnosis Date  . Allergic disorder   . Bartholin cyst   . Gestational diabetes    2015  . Headache(784.0)   . Irregular menstrual cycle   . Pregnancy induced hypertension    with pregnancy in 2005  . Sickle cell trait (HCC)   . SROM (spontaneous rupture of membranes) 05/11/2011  . Urinary tract infection      Past Surgical History:  Procedure Laterality Date  . barthilon cyst    . THERAPEUTIC ABORTION      Family History  Problem Relation Age of Onset  . Asthma Father   . Heart failure Father   . Diabetes Sister   . Hyperlipidemia Sister   . Hyperlipidemia Sister    . Hyperlipidemia Sister   . Hyperlipidemia Sister   . Diabetes Sister   . Diabetes Sister   . Cancer Sister   . Lupus Maternal Grandmother   . Alcohol abuse Neg Hx   . Arthritis Neg Hx   . Birth defects Neg Hx   . COPD Neg Hx   . Depression Neg Hx   . Drug abuse Neg Hx   . Early death Neg Hx   . Hearing loss Neg Hx   . Heart disease Neg Hx   . Hypertension Neg Hx   . Kidney disease Neg Hx   . Learning disabilities Neg Hx   . Mental illness Neg Hx   . Mental retardation Neg Hx   . Miscarriages / Stillbirths Neg Hx   . Stroke Neg Hx   . Vision loss Neg Hx     Social History   Tobacco Use  . Smoking status: Never Smoker  . Smokeless tobacco: Never Used  Vaping Use  . Vaping Use: Never used  Substance Use Topics  . Alcohol use: Yes    Alcohol/week: 0.0 standard drinks    Comment: occasional  . Drug use: No    ROS   Objective:   Vitals: BP (!) 139/100 (BP Location: Right Arm)   Pulse (!) 119   Temp 98.9 F (37.2 C) (Oral)  Resp 20   SpO2 100%   Pulse recheck was 103-106.   Physical Exam Constitutional:      General: She is not in acute distress.    Appearance: Normal appearance. She is well-developed and normal weight. She is not ill-appearing, toxic-appearing or diaphoretic.  HENT:     Head: Normocephalic and atraumatic.     Right Ear: External ear normal.     Left Ear: External ear normal.     Nose: Nose normal.     Mouth/Throat:     Mouth: Mucous membranes are moist.     Pharynx: Oropharynx is clear.  Eyes:     General: No scleral icterus.    Extraocular Movements: Extraocular movements intact.     Pupils: Pupils are equal, round, and reactive to light.  Cardiovascular:     Rate and Rhythm: Normal rate and regular rhythm.     Heart sounds: Normal heart sounds. No murmur heard. No friction rub. No gallop.   Pulmonary:     Effort: Pulmonary effort is normal. No respiratory distress.     Breath sounds: Normal breath sounds. No stridor. No  wheezing, rhonchi or rales.  Abdominal:     General: Bowel sounds are normal. There is no distension.     Palpations: Abdomen is soft. There is no mass.     Tenderness: There is no abdominal tenderness. There is no right CVA tenderness, left CVA tenderness, guarding or rebound.  Skin:    General: Skin is warm and dry.     Coloration: Skin is not pale.     Findings: No rash.  Neurological:     General: No focal deficit present.     Mental Status: She is alert and oriented to person, place, and time.  Psychiatric:        Mood and Affect: Mood normal.        Behavior: Behavior normal.        Thought Content: Thought content normal.        Judgment: Judgment normal.     Results for orders placed or performed during the hospital encounter of 03/26/20 (from the past 24 hour(s))  POC CBG monitoring     Status: Abnormal   Collection Time: 03/26/20  7:50 PM  Result Value Ref Range   Glucose-Capillary 249 (H) 70 - 99 mg/dL    Assessment and Plan :   PDMP not reviewed this encounter.  1. Hyperglycemia   2. Uncontrolled type 2 diabetes mellitus with hyperglycemia (HCC)     Patient is asymptomatic, reviewed her recent labs with her as her PCP has.  Counseled that she should really consider starting Lantus as her primary care provider has recommended.  She is unable to tolerate Metformin very well and therefore advised that Lantus may be a better option for her.  Also discussed that she is just starting Januvia and may end up getting to a higher dose which could also help.  At this time patient does not have any signs of medical emergency including diabetic ketoacidosis and therefore will discharge home with close follow-up with her PCP. Counseled patient on potential for adverse effects with medications prescribed/recommended today, ER and return-to-clinic precautions discussed, patient verbalized understanding.    Wallis Bamberg, [place] 03/30/20 [phone]

## 2020-03-26 NOTE — Discharge Instructions (Addendum)

## 2020-03-26 NOTE — ED Triage Notes (Signed)
Patient noticed she was feeling odd during the night.  Patient woke, not feeling well.  cbg 281.  Patient does not have readings like this.  Patient has been taking medicines as prescribed.  Patient contacted pcp.  pcp prescribed a medicine:. januvia-took one dose.  cbg 153.  Then took metformin 1 hour ago.  Now cbg is 253.  Overall, patient has felt jittery all day.

## 2020-07-15 DIAGNOSIS — Z8616 Personal history of COVID-19: Secondary | ICD-10-CM

## 2020-07-15 HISTORY — DX: Personal history of COVID-19: Z86.16

## 2020-08-28 ENCOUNTER — Encounter: Payer: Self-pay | Admitting: Obstetrics and Gynecology

## 2020-08-28 ENCOUNTER — Ambulatory Visit (INDEPENDENT_AMBULATORY_CARE_PROVIDER_SITE_OTHER): Payer: Medicaid Other | Admitting: Obstetrics and Gynecology

## 2020-08-28 ENCOUNTER — Other Ambulatory Visit (HOSPITAL_COMMUNITY)
Admission: RE | Admit: 2020-08-28 | Discharge: 2020-08-28 | Disposition: A | Discharge status: Home / Self Care | Payer: Medicaid Other | Source: Ambulatory Visit | Attending: Obstetrics and Gynecology | Admitting: Obstetrics and Gynecology

## 2020-08-28 ENCOUNTER — Other Ambulatory Visit: Payer: Self-pay

## 2020-08-28 VITALS — BP 119/86 | HR 91 | Ht 63.0 in | Wt 155.0 lb

## 2020-08-28 DIAGNOSIS — Z3009 Encounter for other general counseling and advice on contraception: Secondary | ICD-10-CM

## 2020-08-28 DIAGNOSIS — Z01419 Encounter for gynecological examination (general) (routine) without abnormal findings: Secondary | ICD-10-CM

## 2020-08-28 NOTE — Progress Notes (Signed)
Subjective:     Lauren [name] is a 35 y.o. female P3 with BMI 27 is here for a comprehensive physical exam. The patient reports no problems. Patient has had Nexplanon in place since 08/2018. She reports amenorrhea since insertion. She is sexually active without complaints. She denies pelvic pain or abnormal discharge. She denies urinary incontinence. Patient is without any complaints. Patient is interested in permanent sterilization. She denies to have Nexplanon removed in order to "re-balance" her hormones.  Past Medical History:  Diagnosis Date   Allergic disorder    Bartholin cyst    Gestational diabetes    2015   Headache(784.0)    Irregular menstrual cycle    Pregnancy induced hypertension    with pregnancy in 2005   Sickle cell trait (HCC)    SROM (spontaneous rupture of membranes) 05/11/2011   Urinary tract infection    Past Surgical History:  Procedure Laterality Date   barthilon cyst     THERAPEUTIC ABORTION     Family History  Problem Relation Age of Onset   Asthma Father    Heart failure Father    Diabetes Sister    Hyperlipidemia Sister    Hyperlipidemia Sister    Hyperlipidemia Sister    Hyperlipidemia Sister    Diabetes Sister    Diabetes Sister    Cancer Sister    Lupus Maternal Grandmother    Alcohol abuse Neg Hx    Arthritis Neg Hx    Birth defects Neg Hx    COPD Neg Hx    Depression Neg Hx    Drug abuse Neg Hx    Early death Neg Hx    Hearing loss Neg Hx    Heart disease Neg Hx    Hypertension Neg Hx    Kidney disease Neg Hx    Learning disabilities Neg Hx    Mental illness Neg Hx    Mental retardation Neg Hx    Miscarriages / Stillbirths Neg Hx    Stroke Neg Hx    Vision loss Neg Hx     Social History   Socioeconomic History   Marital status: Single    Spouse name: Not on file   Number of children: Not on file   Years of education: Not on file   Highest education level: Not on file  Occupational History   Not on file  Tobacco Use    Smoking status: Never   Smokeless tobacco: Never  Vaping Use   Vaping Use: Never used  Substance and Sexual Activity   Alcohol use: Yes    Alcohol/week: 0.0 standard drinks    Comment: occasional   Drug use: No   Sexual activity: Yes    Partners: Male    Birth control/protection: Implant  Other Topics Concern   Not on file  Social History Narrative   Not on file   Social Determinants of Health   Financial Resource Strain: Not on file  Food Insecurity: Not on file  Transportation Needs: Not on file  Physical Activity: Not on file  Stress: Not on file  Social Connections: Not on file  Intimate Partner Violence: Not on file   Health Maintenance  Topic Date Due   OPHTHALMOLOGY EXAM  Never done   URINE MICROALBUMIN  Never done   HEMOGLOBIN A1C  05/16/2017   FOOT EXAM  11/16/2017   PAP SMEAR-Modifier  08/12/2019   INFLUENZA VACCINE  09/14/2020   TETANUS/TDAP  07/13/2023   PNEUMOCOCCAL POLYSACCHARIDE VACCINE AGE 64-64 HIGH  RISK  Completed   COVID-19 Vaccine  Completed   Hepatitis C Screening  Completed   HIV Screening  Completed   Pneumococcal Vaccine 43-29 Years old  Aged Out   HPV VACCINES  Aged Out       Review of Systems Pertinent items noted in HPI and remainder of comprehensive ROS otherwise negative.   Objective:  Blood pressure 119/86, pulse 91, height 5\' 3"  (1.6 m), weight 155 lb (70.3 kg).    GENERAL: Well-developed, well-nourished female in no acute distress.  HEENT: Normocephalic, atraumatic. Sclerae anicteric.  NECK: Supple. Normal thyroid.  LUNGS: Clear to auscultation bilaterally.  HEART: Regular rate and rhythm. BREASTS: Symmetric in size. No palpable masses or lymphadenopathy, skin changes, or nipple drainage. ABDOMEN: Soft, nontender, nondistended. No organomegaly. PELVIC: Normal external female genitalia. Vagina is pink and rugated.  Normal discharge. Normal appearing cervix. Uterus is normal in size.  No adnexal mass or  tenderness. EXTREMITIES: No cyanosis, clubbing, or edema, 2+ distal pulses.    Assessment:    Healthy female exam.      Plan:    Pap smear collected  STI screening per patient request Patient desires permanent sterilization. Other reversible forms of contraception were discussed with patient; she declines all other modalities.  Risks of procedure discussed with patient including permanence of method, bleeding, infection, injury to surrounding organs and need for additional procedures including laparotomy, risk of regret.  Failure risk of 0.5-1% with increased risk of ectopic gestation if pregnancy occurs was also discussed with patient.  Patient agrees to proceed with laparoscopic bilateral salpingectomy and Nexplanon removal Medicaid form completed today  Patient will be contacted with abnormal results See After Visit Summary for Counseling Recommendations

## 2020-08-28 NOTE — Progress Notes (Signed)
Pt is here for yearly exam and to discuss tubal. She currently has a nexplanon which is due to come out in July 2023.

## 2020-08-29 LAB — HIV ANTIBODY (ROUTINE TESTING W REFLEX): HIV Screen 4th Generation wRfx: NONREACTIVE

## 2020-08-29 LAB — HEPATITIS C ANTIBODY: Hep C Virus Ab: 0.1 s/co ratio (ref 0.0–0.9)

## 2020-08-29 LAB — RPR: RPR Ser Ql: NONREACTIVE

## 2020-08-29 LAB — HEPATITIS B SURFACE ANTIGEN: Hepatitis B Surface Ag: NEGATIVE

## 2020-08-31 LAB — CERVICOVAGINAL ANCILLARY ONLY
Chlamydia: NEGATIVE
Comment: NEGATIVE
Comment: NORMAL
Neisseria Gonorrhea: NEGATIVE

## 2020-09-04 LAB — CYTOLOGY - PAP
Comment: NEGATIVE
Comment: NEGATIVE
Diagnosis: NEGATIVE
HPV 16: NEGATIVE
HPV 18 / 45: NEGATIVE
High risk HPV: POSITIVE — AB

## 2020-09-15 ENCOUNTER — Other Ambulatory Visit: Payer: Self-pay | Admitting: Obstetrics and Gynecology

## 2020-10-15 ENCOUNTER — Other Ambulatory Visit: Payer: Self-pay

## 2020-10-15 ENCOUNTER — Encounter (HOSPITAL_BASED_OUTPATIENT_CLINIC_OR_DEPARTMENT_OTHER): Payer: Self-pay | Admitting: Obstetrics and Gynecology

## 2020-10-15 NOTE — Progress Notes (Signed)
Spoke w/ via phone for pre-op interview--- Pt Lab needs dos----  istat, urine preg, ekg, t&s             Lab results------ no COVID test -----patient states asymptomatic no test needed Arrive at ------- 1100 on 10-21-2020 NPO after MN NO Solid Food.  Clear liquids from MN until--- 1000 Med rec completed Medications to take morning of surgery ----- Protonix Diabetic medication ----- do not take farxiga morning of surgery Patient instructed no nail polish to be worn day of surgery Patient instructed to bring photo id and insurance card day of surgery Patient aware to have Driver (ride ) / caregiver for 24 hours after surgery --mother, Chiquita Loth Patient Special Instructions ----- n/a Pre-Op special Istructions ----- n/a Patient verbalized understanding of instructions that were given at this phone interview. Patient denies shortness of breath, chest pain, fever, cough at this phone interview.

## 2020-10-20 ENCOUNTER — Telehealth: Payer: Self-pay | Admitting: Obstetrics and Gynecology

## 2020-10-20 ENCOUNTER — Telehealth: Payer: Self-pay

## 2020-10-21 ENCOUNTER — Ambulatory Visit (HOSPITAL_BASED_OUTPATIENT_CLINIC_OR_DEPARTMENT_OTHER)
Admission: RE | Admit: 2020-10-21 | Payer: Medicaid Other | Source: Home / Self Care | Admitting: Obstetrics and Gynecology

## 2020-10-21 HISTORY — DX: Personal history of gestational diabetes: Z86.32

## 2020-10-21 HISTORY — DX: Hyperlipidemia, unspecified: E78.5

## 2020-10-21 HISTORY — DX: Other seasonal allergic rhinitis: J30.2

## 2020-10-21 HISTORY — DX: Other allergy status, other than to drugs and biological substances: Z91.09

## 2020-10-21 HISTORY — DX: Type 2 diabetes mellitus without complications: E11.9

## 2020-10-21 SURGERY — SALPINGECTOMY, BILATERAL, LAPAROSCOPIC
Anesthesia: Choice | Laterality: Bilateral

## 2020-12-31 ENCOUNTER — Telehealth: Payer: Self-pay

## 2020-12-31 NOTE — Telephone Encounter (Signed)
Pt called stating she thinks she has a bartholin's gland cyst. States she has had them in the past and was wanting to be seen. Advised patient there were no available appointments until Monday. Pt was fine with this. Advised patient to use warm compresses and sitz bath in the interim. Pt states she has been doing this. She has also been using tylenol and ibuprofen for pain control. Pt denies any drainage from the area and states it is not very large so does not know if it would need lanced or could be treated with antibiotics at this point. Advised patient should she become too uncomfortable over the weekend to seek care at urgent care. Pt agreed and verbalized understanding.

## 2021-01-04 ENCOUNTER — Ambulatory Visit: Payer: Medicaid Other | Admitting: Obstetrics

## 2021-07-28 ENCOUNTER — Ambulatory Visit: Payer: Medicaid Other | Admitting: Obstetrics

## 2021-08-30 ENCOUNTER — Ambulatory Visit (INDEPENDENT_AMBULATORY_CARE_PROVIDER_SITE_OTHER): Payer: Medicaid Other | Admitting: Obstetrics & Gynecology

## 2021-08-30 ENCOUNTER — Encounter: Payer: Self-pay | Admitting: Obstetrics & Gynecology

## 2021-08-30 ENCOUNTER — Other Ambulatory Visit (HOSPITAL_COMMUNITY)
Admission: RE | Admit: 2021-08-30 | Discharge: 2021-08-30 | Disposition: A | Discharge status: Home / Self Care | Payer: Medicaid Other | Source: Ambulatory Visit | Attending: Obstetrics | Admitting: Obstetrics

## 2021-08-30 VITALS — BP 105/71 | HR 102 | Ht 64.0 in | Wt 151.0 lb

## 2021-08-30 DIAGNOSIS — Z01411 Encounter for gynecological examination (general) (routine) with abnormal findings: Secondary | ICD-10-CM

## 2021-08-30 DIAGNOSIS — R87810 Cervical high risk human papillomavirus (HPV) DNA test positive: Secondary | ICD-10-CM | POA: Insufficient documentation

## 2021-08-30 DIAGNOSIS — Z3046 Encounter for surveillance of implantable subdermal contraceptive: Secondary | ICD-10-CM

## 2021-08-30 DIAGNOSIS — Z01419 Encounter for gynecological examination (general) (routine) without abnormal findings: Secondary | ICD-10-CM

## 2021-08-30 DIAGNOSIS — Z113 Encounter for screening for infections with a predominantly sexual mode of transmission: Secondary | ICD-10-CM | POA: Insufficient documentation

## 2021-08-30 MED ORDER — ETONOGESTREL 68 MG ~~LOC~~ IMPL
68.0000 mg | DRUG_IMPLANT | Freq: Once | SUBCUTANEOUS | Status: AC
Start: 2021-08-30 — End: 2021-08-30
  Administered 2021-08-30: 68 mg via SUBCUTANEOUS

## 2021-08-30 NOTE — Progress Notes (Signed)
GYNECOLOGY ANNUAL PREVENTATIVE CARE ENCOUNTER NOTE  History:     Lauren [name] is a 36 y.o. [phone] female here for a routine annual gynecologic exam and repeat pap smear. Also desires removal and reinsertion of Nexplanon; and annual STI screen.  Current complaints: none.   Denies abnormal vaginal bleeding, discharge, pelvic pain, problems with intercourse or other gynecologic concerns.    Gynecologic History No LMP recorded. Patient has had an implant. Contraception: Nexplanon in place for over three years now Last Pap: 08/28/2020. Result was normal with positive HPV  Obstetric History OB History  Gravida Para Term Preterm AB Living  4 3 2 1 1 3   SAB IAB Ectopic Multiple Live Births  0 1 0 0 3    # Outcome Date GA Lbr Len/2nd Weight Sex Delivery Anes PTL Lv  4 Term 07/15/13 [redacted]w[redacted]d 01:45 / 00:37 7 lb 9.7 oz (3.45 kg) M Vag-Spont EPI  LIV  3 Term 05/11/11 [redacted]w[redacted]d 11:10 / 00:22 7 lb 6.9 oz (3.37 kg) M Vag-Spont EPI  LIV  2 Preterm 06/02/03 [redacted]w[redacted]d   F Vag-Spont EPI  LIV     Birth Comments: induced for high blood pressure; borderline diabetic  1 IAB             Past Medical History:  Diagnosis Date   Environmental allergies    History of COVID-19 07/2020   per pt mild symptoms that resolved   History of gestational diabetes    History of pregnancy induced hypertension 2005   Hyperlipidemia    Seasonal allergic rhinitis    Sickle cell trait (HCC)    Type 2 diabetes mellitus (HCC)    followed by pcp and endocrinologist--- dr m. levy  (10-15-2020 pt stated checks blood sugar 2-3 times daily, fasting sugar-- 130s)    Past Surgical History:  Procedure Laterality Date   COLONOSCOPY WITH ESOPHAGOGASTRODUODENOSCOPY (EGD)  12/2018    Current Outpatient Medications on File Prior to Visit  Medication Sig Dispense Refill   dapagliflozin propanediol (FARXIGA) 10 MG TABS tablet Take 10 mg by mouth daily.     EPINEPHrine 0.3 mg/0.3 mL IJ SOAJ injection Inject 0.3 mg into the muscle  as needed.     levocetirizine (XYZAL) 5 MG tablet Take 5 mg by mouth daily as needed.     losartan (COZAAR) 25 MG tablet Take 25 mg by mouth daily.     metFORMIN (GLUCOPHAGE-XR) 500 MG 24 hr tablet TAKE 1 TABLET BY MOUTH EVERY DAY WITH BREAKFAST (Patient not taking: Reported on 08/30/2021) 90 tablet 1   montelukast (SINGULAIR) 10 MG tablet Take 10 mg by mouth at bedtime.     Olopatadine HCl 0.2 % SOLN Apply 1 drop to eye daily as needed.     Omega-3 Fatty Acids (FISH OIL) 1000 MG CAPS Take 1 capsule by mouth at bedtime.     pantoprazole (PROTONIX) 40 MG tablet Take 40 mg by mouth 2 (two) times daily.     rosuvastatin (CRESTOR) 10 MG tablet Take 10 mg by mouth at bedtime.     Semaglutide,0.25 or 0.5MG /DOS, (OZEMPIC, 0.25 OR 0.5 MG/DOSE,) 2 MG/1.5ML SOPN Inject 1 mg into the skin once a week. Thursday's     valACYclovir (VALTREX) 500 MG tablet Take 500 mg by mouth at bedtime.     No current facility-administered medications on file prior to visit.    Allergies  Allergen Reactions   Bee Venom Anaphylaxis   Kiwi Extract Itching   Peanut-Containing Drug Products Itching  All tree nuts   Liraglutide Palpitations and Other (See Comments)    GERD    Social History:  reports that she has never smoked. She has never used smokeless tobacco. She reports that she does not currently use alcohol. She reports that she does not use drugs.  Family History  Problem Relation Age of Onset   Asthma Father    Heart failure Father    Diabetes Sister    Hyperlipidemia Sister    Hyperlipidemia Sister    Hyperlipidemia Sister    Hyperlipidemia Sister    Diabetes Sister    Diabetes Sister    Cancer Sister    Lupus Maternal Grandmother    Alcohol abuse Neg Hx    Arthritis Neg Hx    Birth defects Neg Hx    COPD Neg Hx    Depression Neg Hx    Drug abuse Neg Hx    Early death Neg Hx    Hearing loss Neg Hx    Heart disease Neg Hx    Hypertension Neg Hx    Kidney disease Neg Hx    Learning  disabilities Neg Hx    Mental illness Neg Hx    Mental retardation Neg Hx    Miscarriages / Stillbirths Neg Hx    Stroke Neg Hx    Vision loss Neg Hx     The following portions of the patient's history were reviewed and updated as appropriate: allergies, current medications, past family history, past medical history, past social history, past surgical history and problem list.  Review of Systems Pertinent items noted in HPI and remainder of comprehensive ROS otherwise negative.  Physical Exam:  BP 105/71   Pulse (!) 102   Ht 5\' 4"  (1.626 m)   Wt 151 lb (68.5 kg)   BMI 25.92 kg/m  CONSTITUTIONAL: Well-developed, well-nourished female in no acute distress.  HENT:  Normocephalic, atraumatic, External right and left ear normal.  EYES: Conjunctivae and EOM are normal. Pupils are equal, round, and reactive to light. No scleral icterus.  NECK: Normal range of motion, supple, no masses.  Normal thyroid.  SKIN: Skin is warm and dry. No rash noted. Not diaphoretic. No erythema. No pallor. MUSCULOSKELETAL: Normal range of motion. No tenderness.  No cyanosis, clubbing, or edema. NEUROLOGIC: Alert and oriented to person, place, and time. Normal reflexes, muscle tone coordination.  PSYCHIATRIC: Normal mood and affect. Normal behavior. Normal judgment and thought content. CARDIOVASCULAR: Normal heart rate noted, regular rhythm RESPIRATORY: Clear to auscultation bilaterally. Effort and breath sounds normal, no problems with respiration noted. BREASTS: Declined by patient. ABDOMEN: Soft, no distention noted.  No tenderness, rebound or guarding.  PELVIC: Normal appearing external genitalia and urethral meatus; normal appearing vaginal mucosa and cervix.  No abnormal vaginal discharge noted.  Pap smear obtained.  Normal uterine size, no other palpable masses, no uterine or adnexal tenderness.  Performed in the presence of a chaperone.   Nexplanon Removal and Reinsertion Patient identified, informed  consent performed, consent signed.   Patient does understand that irregular bleeding is a very common side effect of this medication. Other side effects discussed.  Appropriate time out taken. Nexplanon site identified in left arm.  Area prepped in usual sterile fashon. One ml of 1% lidocaine was used to anesthetize the area at the distal end of the implant. A small stab incision was made right beside the implant on the distal portion. The Nexplanon rod was grasped using hemostats and removed without difficulty. There was minimal  blood loss. There were no complications. Area was then injected with 3 ml of 1 % lidocaine. She was re-prepped with betadine, Nexplanon removed from packaging, Device confirmed in needle, then inserted full length of needle and withdrawn per handbook instructions. Nexplanon was able to palpated in the patient's arm; patient palpated the insert herself.  There was minimal blood loss. Patient insertion site covered with gauze and a pressure bandage to reduce any bruising. The patient tolerated the procedure well and was given post procedure instructions.   She was advised to have backup contraception for one week after replacement of the implant.      Assessment and Plan:     1. Encounter for removal and reinsertion of Nexplanon - etonogestrel (NEXPLANON) implant 68 mg replaced, can be in place for up to 4 years. Patient told to contact us with any concerns.  2. Routine screening for STI (sexually transmitted infection) STI screen done done, will follow up results and manage accordingly. - Cytology - PAP( [facility]) - RPR+HBsAg+HCVAb+...  3. Cervical high risk HPV (human papillomavirus) test positive in 08/28/2020 4. Well woman exam with routine gynecological exam - Cytology - PAP( [place]) Will follow up results of pap smear and manage accordingly. Discussed +HPV pap, need for yearly testing until at least 2 consecutive normal paps. And need for colposcopy or other  evaluation if needed for abnormal results.  Self-breast examinations recommended. Routine preventative health maintenance measures emphasized. Please refer to After Visit Summary for other counseling recommendations.      Jaynie Collins, MD, FACOG Obstetrician & Gynecologist, [facility] for [employer], [facility]

## 2021-08-30 NOTE — Progress Notes (Addendum)
36 y.o GYN presents for Nexplanon removal and Insertion/PAP.  She has questions about her abnormal PAP +HPV.  Administrations This Visit     etonogestrel (NEXPLANON) implant 68 mg     Admin Date 08/30/2021 Action Given Dose 68 mg Route Subdermal Administered By Maretta Bees, RMA

## 2021-08-30 NOTE — Patient Instructions (Signed)
Nexplanon Instructions After Insertion  Keep bandage clean and dry for 24 hours  May use ice/Tylenol/Ibuprofen for soreness or pain  If you develop fever, drainage or increased warmth from incision site-contact office immediately   

## 2021-08-31 LAB — RPR+HBSAG+HCVAB+...
HIV Screen 4th Generation wRfx: NONREACTIVE
Hep C Virus Ab: NONREACTIVE
Hepatitis B Surface Ag: NEGATIVE
RPR Ser Ql: NONREACTIVE

## 2021-09-03 ENCOUNTER — Encounter: Payer: Self-pay | Admitting: Obstetrics & Gynecology

## 2021-09-03 DIAGNOSIS — R87810 Cervical high risk human papillomavirus (HPV) DNA test positive: Secondary | ICD-10-CM | POA: Insufficient documentation

## 2021-09-03 LAB — CYTOLOGY - PAP
Chlamydia: NEGATIVE
Comment: NEGATIVE
Comment: NEGATIVE
Comment: NEGATIVE
Comment: NEGATIVE
Comment: NEGATIVE
Comment: NORMAL
Diagnosis: NEGATIVE
HPV 16: NEGATIVE
HPV 18 / 45: NEGATIVE
High risk HPV: POSITIVE — AB
Neisseria Gonorrhea: NEGATIVE
Trichomonas: NEGATIVE

## 2021-12-21 ENCOUNTER — Encounter (HOSPITAL_COMMUNITY): Payer: Self-pay | Admitting: Emergency Medicine

## 2021-12-21 ENCOUNTER — Ambulatory Visit (HOSPITAL_COMMUNITY)
Admission: EM | Admit: 2021-12-21 | Discharge: 2021-12-21 | Disposition: A | Discharge status: Home / Self Care | Payer: BC Managed Care – PPO | Attending: Family Medicine | Admitting: Family Medicine

## 2021-12-21 ENCOUNTER — Ambulatory Visit (INDEPENDENT_AMBULATORY_CARE_PROVIDER_SITE_OTHER): Payer: BC Managed Care – PPO

## 2021-12-21 DIAGNOSIS — M549 Dorsalgia, unspecified: Secondary | ICD-10-CM

## 2021-12-21 MED ORDER — KETOROLAC TROMETHAMINE 30 MG/ML IJ SOLN
30.0000 mg | Freq: Once | INTRAMUSCULAR | Status: AC
  Administered 2021-12-21: 30 mg via INTRAMUSCULAR

## 2021-12-21 MED ORDER — NAPROXEN 500 MG PO TABS: 500.0000 mg | ORAL_TABLET | Freq: Two times a day (BID) | ORAL | 0 refills | Status: AC | PRN

## 2021-12-21 MED ORDER — KETOROLAC TROMETHAMINE 30 MG/ML IJ SOLN
INTRAMUSCULAR | Status: AC
  Filled 2021-12-21: qty 1

## 2021-12-21 NOTE — Discharge Instructions (Signed)
You have been given a shot of Toradol 30 mg today.  Take naproxen 500 mg--1 tablet every 12 hours as needed for pain  Please follow-up with your primary care about this issue
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
## 2022-06-09 ENCOUNTER — Other Ambulatory Visit (HOSPITAL_BASED_OUTPATIENT_CLINIC_OR_DEPARTMENT_OTHER): Payer: Self-pay

## 2022-06-09 MED ORDER — MOUNJARO 7.5 MG/0.5ML ~~LOC~~ SOAJ
7.5000 mg | SUBCUTANEOUS | 2 refills | Status: DC
  Filled 2022-06-09: qty 2, 28d supply, fill #0
  Filled 2022-07-14 – 2022-07-15 (×3): qty 2, 28d supply, fill #1
  Filled 2022-08-10: qty 2, 28d supply, fill #2

## 2022-07-14 ENCOUNTER — Other Ambulatory Visit (HOSPITAL_BASED_OUTPATIENT_CLINIC_OR_DEPARTMENT_OTHER): Payer: Self-pay

## 2022-07-15 ENCOUNTER — Other Ambulatory Visit (HOSPITAL_BASED_OUTPATIENT_CLINIC_OR_DEPARTMENT_OTHER): Payer: Self-pay

## 2022-09-07 ENCOUNTER — Other Ambulatory Visit (HOSPITAL_BASED_OUTPATIENT_CLINIC_OR_DEPARTMENT_OTHER): Payer: Self-pay

## 2022-09-08 ENCOUNTER — Other Ambulatory Visit (HOSPITAL_BASED_OUTPATIENT_CLINIC_OR_DEPARTMENT_OTHER): Payer: Self-pay

## 2022-09-08 MED ORDER — MOUNJARO 7.5 MG/0.5ML ~~LOC~~ SOAJ
7.5000 mg | SUBCUTANEOUS | 2 refills | Status: AC
  Filled 2022-09-08: qty 2, 28d supply, fill #0

## 2022-09-13 ENCOUNTER — Other Ambulatory Visit (HOSPITAL_BASED_OUTPATIENT_CLINIC_OR_DEPARTMENT_OTHER): Payer: Self-pay

## 2022-10-13 ENCOUNTER — Other Ambulatory Visit (HOSPITAL_BASED_OUTPATIENT_CLINIC_OR_DEPARTMENT_OTHER): Payer: Self-pay

## 2022-10-13 MED ORDER — MOUNJARO 10 MG/0.5ML ~~LOC~~ SOAJ
10.0000 mg | SUBCUTANEOUS | 0 refills | Status: DC
  Filled 2022-10-13 – 2022-11-10 (×2): qty 2, 28d supply, fill #0

## 2022-10-13 MED ORDER — MOUNJARO 10 MG/0.5ML ~~LOC~~ SOAJ
10.0000 mg | SUBCUTANEOUS | 0 refills | Status: AC
  Filled 2022-10-13: qty 2, 28d supply, fill #0

## 2022-11-10 ENCOUNTER — Other Ambulatory Visit (HOSPITAL_BASED_OUTPATIENT_CLINIC_OR_DEPARTMENT_OTHER): Payer: Self-pay

## 2022-12-08 ENCOUNTER — Other Ambulatory Visit (HOSPITAL_BASED_OUTPATIENT_CLINIC_OR_DEPARTMENT_OTHER): Payer: Self-pay

## 2022-12-08 MED ORDER — MOUNJARO 10 MG/0.5ML ~~LOC~~ SOAJ
10.0000 mg | SUBCUTANEOUS | 0 refills | Status: DC
  Filled 2022-12-08: qty 2, 28d supply, fill #0

## 2023-01-10 ENCOUNTER — Other Ambulatory Visit (HOSPITAL_BASED_OUTPATIENT_CLINIC_OR_DEPARTMENT_OTHER): Payer: Self-pay

## 2023-01-11 ENCOUNTER — Other Ambulatory Visit: Payer: Self-pay

## 2023-01-11 ENCOUNTER — Other Ambulatory Visit (HOSPITAL_BASED_OUTPATIENT_CLINIC_OR_DEPARTMENT_OTHER): Payer: Self-pay

## 2023-01-11 MED ORDER — MOUNJARO 10 MG/0.5ML ~~LOC~~ SOAJ
10.0000 mg | SUBCUTANEOUS | 0 refills | Status: DC
  Filled 2023-01-11: qty 2, 28d supply, fill #0

## 2023-02-09 ENCOUNTER — Other Ambulatory Visit (HOSPITAL_BASED_OUTPATIENT_CLINIC_OR_DEPARTMENT_OTHER): Payer: Self-pay

## 2023-02-09 MED ORDER — MOUNJARO 10 MG/0.5ML ~~LOC~~ SOAJ
10.0000 mg | SUBCUTANEOUS | 2 refills | Status: AC
  Filled 2023-02-09 – 2023-03-06 (×2): qty 2, 28d supply, fill #0
  Filled 2023-04-02: qty 2, 28d supply, fill #1

## 2023-02-09 MED ORDER — MOUNJARO 10 MG/0.5ML ~~LOC~~ SOAJ
10.0000 mg | SUBCUTANEOUS | 0 refills | Status: DC
  Filled 2023-02-09: qty 2, 28d supply, fill #0

## 2023-03-06 ENCOUNTER — Other Ambulatory Visit (HOSPITAL_BASED_OUTPATIENT_CLINIC_OR_DEPARTMENT_OTHER): Payer: Self-pay

## 2023-04-19 ENCOUNTER — Other Ambulatory Visit (HOSPITAL_BASED_OUTPATIENT_CLINIC_OR_DEPARTMENT_OTHER): Payer: Self-pay

## 2023-04-19 MED ORDER — MOUNJARO 12.5 MG/0.5ML ~~LOC~~ SOAJ
12.5000 mg | SUBCUTANEOUS | 2 refills | Status: AC
Start: 2023-04-19 — End: ?
  Filled 2023-04-19 – 2023-04-27 (×5): qty 2, 28d supply, fill #0
  Filled 2023-05-24: qty 2, 28d supply, fill #1
  Filled 2023-07-04: qty 2, 28d supply, fill #2

## 2023-04-20 ENCOUNTER — Other Ambulatory Visit (HOSPITAL_BASED_OUTPATIENT_CLINIC_OR_DEPARTMENT_OTHER): Payer: Self-pay

## 2023-04-27 ENCOUNTER — Other Ambulatory Visit (HOSPITAL_BASED_OUTPATIENT_CLINIC_OR_DEPARTMENT_OTHER): Payer: Self-pay

## 2023-07-11 ENCOUNTER — Other Ambulatory Visit (HOSPITAL_BASED_OUTPATIENT_CLINIC_OR_DEPARTMENT_OTHER): Payer: Self-pay

## 2023-07-11 MED ORDER — MOUNJARO 12.5 MG/0.5ML ~~LOC~~ SOAJ
12.5000 mg | SUBCUTANEOUS | 1 refills | Status: AC
  Filled 2023-07-11 – 2023-08-01 (×2): qty 2, 28d supply, fill #0
  Filled 2023-08-01: qty 6, 84d supply, fill #0
  Filled 2023-08-26: qty 2, 28d supply, fill #1
  Filled 2023-09-23: qty 2, 28d supply, fill #2
  Filled 2023-10-30 (×2): qty 2, 28d supply, fill #3
  Filled 2023-12-01 (×2): qty 2, 28d supply, fill #4
  Filled 2023-12-25: qty 2, 28d supply, fill #5

## 2023-08-01 ENCOUNTER — Other Ambulatory Visit (HOSPITAL_BASED_OUTPATIENT_CLINIC_OR_DEPARTMENT_OTHER): Payer: Self-pay

## 2023-08-02 ENCOUNTER — Other Ambulatory Visit: Payer: Self-pay

## 2023-09-29 ENCOUNTER — Other Ambulatory Visit (HOSPITAL_BASED_OUTPATIENT_CLINIC_OR_DEPARTMENT_OTHER): Payer: Self-pay

## 2023-10-04 ENCOUNTER — Other Ambulatory Visit (HOSPITAL_BASED_OUTPATIENT_CLINIC_OR_DEPARTMENT_OTHER): Payer: Self-pay

## 2023-10-17 ENCOUNTER — Other Ambulatory Visit (HOSPITAL_BASED_OUTPATIENT_CLINIC_OR_DEPARTMENT_OTHER): Payer: Self-pay

## 2023-10-17 MED ORDER — MOUNJARO 12.5 MG/0.5ML ~~LOC~~ SOAJ
12.5000 mg | SUBCUTANEOUS | 1 refills | Status: AC
  Filled 2023-10-17 – 2023-12-21 (×2): qty 2, 28d supply, fill #0
  Filled 2024-01-21: qty 6, 84d supply, fill #0
  Filled 2024-02-01 (×2): qty 2, 28d supply, fill #0
  Filled 2024-02-28: qty 2, 28d supply, fill #1
  Filled 2024-03-20: qty 2, 28d supply, fill #2

## 2023-10-20 ENCOUNTER — Other Ambulatory Visit (HOSPITAL_BASED_OUTPATIENT_CLINIC_OR_DEPARTMENT_OTHER): Payer: Self-pay

## 2023-10-27 ENCOUNTER — Other Ambulatory Visit (HOSPITAL_BASED_OUTPATIENT_CLINIC_OR_DEPARTMENT_OTHER): Payer: Self-pay

## 2023-10-30 ENCOUNTER — Other Ambulatory Visit (HOSPITAL_BASED_OUTPATIENT_CLINIC_OR_DEPARTMENT_OTHER): Payer: Self-pay

## 2023-12-01 ENCOUNTER — Other Ambulatory Visit (HOSPITAL_BASED_OUTPATIENT_CLINIC_OR_DEPARTMENT_OTHER): Payer: Self-pay

## 2023-12-21 ENCOUNTER — Other Ambulatory Visit (HOSPITAL_BASED_OUTPATIENT_CLINIC_OR_DEPARTMENT_OTHER): Payer: Self-pay

## 2023-12-21 ENCOUNTER — Other Ambulatory Visit: Payer: Self-pay

## 2024-01-22 ENCOUNTER — Other Ambulatory Visit (HOSPITAL_BASED_OUTPATIENT_CLINIC_OR_DEPARTMENT_OTHER): Payer: Self-pay

## 2024-01-22 ENCOUNTER — Other Ambulatory Visit: Payer: Self-pay

## 2024-02-01 ENCOUNTER — Other Ambulatory Visit (HOSPITAL_BASED_OUTPATIENT_CLINIC_OR_DEPARTMENT_OTHER): Payer: Self-pay

## 2024-02-02 ENCOUNTER — Other Ambulatory Visit (HOSPITAL_BASED_OUTPATIENT_CLINIC_OR_DEPARTMENT_OTHER): Payer: Self-pay

## 2024-03-19 ENCOUNTER — Other Ambulatory Visit (HOSPITAL_BASED_OUTPATIENT_CLINIC_OR_DEPARTMENT_OTHER): Payer: Self-pay

## 2024-03-19 MED ORDER — MOUNJARO 10 MG/0.5ML ~~LOC~~ SOAJ
10.0000 mg | SUBCUTANEOUS | 1 refills | Status: AC
  Filled 2024-03-19 – 2024-03-21 (×3): qty 2, 28d supply, fill #0

## 2024-03-20 ENCOUNTER — Other Ambulatory Visit: Payer: Self-pay

## 2024-03-21 ENCOUNTER — Other Ambulatory Visit (HOSPITAL_BASED_OUTPATIENT_CLINIC_OR_DEPARTMENT_OTHER): Payer: Self-pay
